# Patient Record
Sex: Female | Born: 1955 | Race: White | Hispanic: No | Marital: Married | Smoking: Former smoker
Health system: Southern US, Community
[De-identification: ages and names within clinical notes are randomized; demographics above are authoritative.]

## PROBLEM LIST (undated history)

## (undated) DIAGNOSIS — C3490 Malignant neoplasm of unspecified part of unspecified bronchus or lung: Secondary | ICD-10-CM

## (undated) DIAGNOSIS — Z923 Personal history of irradiation: Secondary | ICD-10-CM

## (undated) DIAGNOSIS — D649 Anemia, unspecified: Secondary | ICD-10-CM

## (undated) DIAGNOSIS — J189 Pneumonia, unspecified organism: Secondary | ICD-10-CM

## (undated) DIAGNOSIS — Z8719 Personal history of other diseases of the digestive system: Secondary | ICD-10-CM

## (undated) DIAGNOSIS — C801 Malignant (primary) neoplasm, unspecified: Secondary | ICD-10-CM

## (undated) DIAGNOSIS — F419 Anxiety disorder, unspecified: Secondary | ICD-10-CM

## (undated) DIAGNOSIS — R110 Nausea: Secondary | ICD-10-CM

## (undated) DIAGNOSIS — C259 Malignant neoplasm of pancreas, unspecified: Secondary | ICD-10-CM

## (undated) DIAGNOSIS — E079 Disorder of thyroid, unspecified: Secondary | ICD-10-CM

## (undated) DIAGNOSIS — J40 Bronchitis, not specified as acute or chronic: Secondary | ICD-10-CM

## (undated) DIAGNOSIS — E039 Hypothyroidism, unspecified: Secondary | ICD-10-CM

## (undated) HISTORY — DX: Malignant neoplasm of pancreas, unspecified: C25.9

## (undated) HISTORY — PX: TONSILLECTOMY: SUR1361

## (undated) HISTORY — PX: WISDOM TOOTH EXTRACTION: SHX21

## (undated) HISTORY — DX: Malignant neoplasm of unspecified part of unspecified bronchus or lung: C34.90

## (undated) HISTORY — PX: APPENDECTOMY: SHX54

## (undated) HISTORY — DX: Personal history of irradiation: Z92.3

---

## 1996-12-14 HISTORY — PX: TUBAL LIGATION: SHX77

## 1999-06-26 ENCOUNTER — Ambulatory Visit (HOSPITAL_COMMUNITY): Admission: RE | Admit: 1999-06-26 | Discharge: 1999-06-26 | Payer: Self-pay | Admitting: Obstetrics and Gynecology

## 2000-06-18 ENCOUNTER — Encounter: Admission: RE | Admit: 2000-06-18 | Discharge: 2000-06-18 | Payer: Self-pay | Admitting: Obstetrics and Gynecology

## 2000-06-18 ENCOUNTER — Encounter: Payer: Self-pay | Admitting: Obstetrics and Gynecology

## 2002-05-22 ENCOUNTER — Encounter: Payer: Self-pay | Admitting: Obstetrics and Gynecology

## 2002-05-22 ENCOUNTER — Encounter: Admission: RE | Admit: 2002-05-22 | Discharge: 2002-05-22 | Payer: Self-pay | Admitting: Obstetrics and Gynecology

## 2003-05-24 ENCOUNTER — Encounter: Admission: RE | Admit: 2003-05-24 | Discharge: 2003-05-24 | Payer: Self-pay | Admitting: Obstetrics and Gynecology

## 2003-05-24 ENCOUNTER — Encounter: Payer: Self-pay | Admitting: Obstetrics and Gynecology

## 2005-12-14 HISTORY — PX: HERNIA REPAIR: SHX51

## 2005-12-16 ENCOUNTER — Ambulatory Visit (HOSPITAL_BASED_OUTPATIENT_CLINIC_OR_DEPARTMENT_OTHER): Admission: RE | Admit: 2005-12-16 | Discharge: 2005-12-16 | Payer: Self-pay | Admitting: *Deleted

## 2006-03-24 ENCOUNTER — Encounter: Admission: RE | Admit: 2006-03-24 | Discharge: 2006-03-24 | Payer: Self-pay | Admitting: Internal Medicine

## 2006-03-30 ENCOUNTER — Encounter: Admission: RE | Admit: 2006-03-30 | Discharge: 2006-03-30 | Payer: Self-pay | Admitting: Internal Medicine

## 2006-12-14 HISTORY — PX: ABDOMINAL HYSTERECTOMY: SHX81

## 2007-05-03 ENCOUNTER — Inpatient Hospital Stay (HOSPITAL_COMMUNITY): Admission: RE | Admit: 2007-05-03 | Discharge: 2007-05-05 | Payer: Self-pay | Admitting: Obstetrics and Gynecology

## 2011-02-16 ENCOUNTER — Other Ambulatory Visit: Payer: Self-pay | Admitting: Gastroenterology

## 2012-01-21 ENCOUNTER — Other Ambulatory Visit: Payer: Self-pay | Admitting: Internal Medicine

## 2012-01-26 ENCOUNTER — Ambulatory Visit
Admission: RE | Admit: 2012-01-26 | Discharge: 2012-01-26 | Disposition: A | Discharge status: Home / Self Care | Payer: BC Managed Care – PPO | Source: Ambulatory Visit | Attending: Internal Medicine | Admitting: Internal Medicine

## 2012-01-26 ENCOUNTER — Telehealth: Payer: Self-pay | Admitting: *Deleted

## 2012-01-26 ENCOUNTER — Telehealth: Payer: Self-pay | Admitting: Gastroenterology

## 2012-01-26 ENCOUNTER — Other Ambulatory Visit: Payer: Self-pay

## 2012-01-26 DIAGNOSIS — C259 Malignant neoplasm of pancreas, unspecified: Secondary | ICD-10-CM

## 2012-01-26 MED ORDER — IOHEXOL 300 MG/ML  SOLN
100.0000 mL | Freq: Once | INTRAMUSCULAR | Status: AC | PRN
  Administered 2012-01-26: 100 mL via INTRAVENOUS

## 2012-01-26 NOTE — Telephone Encounter (Signed)
Received referral from [facility] at Dr. Lynne Logan office requesting stat oncology referral.  Dr. Truett Perna reviewed patient information and discussed the case with Dr. Christella Hartigan over the phone.  Per Dr. Truett Perna, Dr. Larae Grooms office will call patient tomorrow with appointment for EUS this week.  Per request of Dr. Lynne Logan office, the patient is not to be called today.  Patient will receive oncology appointment for next week , post visit with Christella Hartigan, later this week..  This information was also discussed with Patty,  at [facility] office, reiterating not to call patient today.  Also, Dr. Lynne Logan office is to fax over the information to Dr. Gerilyn Pilgrim 's office.

## 2012-01-26 NOTE — Telephone Encounter (Signed)
Case is scheduled for 01/29/12 1215 pm [facility] endo, pt will be called tomorrow.

## 2012-01-26 NOTE — Telephone Encounter (Signed)
I spoke with Dr. Mancel Bale. Dr. Lynne Logan office contacted him about this patient.  Patty, Can you contact Dr. Lynne Logan office to get demographics, recent labs, H and P on this patient sent here. She has newly diagnosed pancreatic mass and needs to be scheduled for upper EUS, linear only, [facility], propofol not needed, this Friday 12:15. Please block out my 1:30 L[facility] spot in case the EUS goes long.  Let's book the case today, but wait to contact her until tomorrow morning (Dr. Ricki Miller is discussing the situation with her this afternoon).  Thank you.

## 2012-01-27 NOTE — Telephone Encounter (Signed)
Left message on machine to call back on home phone  Pt has been instructed and meds have been reviewed.  She was given our phone number to call with any questions or concerns

## 2012-01-28 ENCOUNTER — Telehealth: Payer: Self-pay | Admitting: Internal Medicine

## 2012-01-28 ENCOUNTER — Telehealth: Payer: Self-pay | Admitting: *Deleted

## 2012-01-28 NOTE — Telephone Encounter (Signed)
Courtesy phone call to pt to inform her that we are aware of her upcoming appointments and are available to assist her.  I gave patient my phone number and encouraged her to call with questions.  Informed patient I will see her at Oncology MD visit.  She will be receiving a phone call soon from the [facility] scheduler.  She was without questions and appreciated the call.

## 2012-01-28 NOTE — Telephone Encounter (Signed)
Left message on machine to call back  

## 2012-01-29 ENCOUNTER — Encounter (HOSPITAL_COMMUNITY)
Admission: RE | Disposition: A | Discharge status: Home / Self Care | Payer: Self-pay | Source: Ambulatory Visit | Attending: Gastroenterology

## 2012-01-29 ENCOUNTER — Other Ambulatory Visit: Payer: Self-pay | Admitting: Gastroenterology

## 2012-01-29 ENCOUNTER — Ambulatory Visit (HOSPITAL_COMMUNITY)
Admission: RE | Admit: 2012-01-29 | Discharge: 2012-01-29 | Disposition: A | Discharge status: Home / Self Care | Payer: BC Managed Care – PPO | Source: Ambulatory Visit | Attending: Gastroenterology | Admitting: Gastroenterology

## 2012-01-29 ENCOUNTER — Telehealth: Payer: Self-pay | Admitting: Oncology

## 2012-01-29 ENCOUNTER — Encounter (HOSPITAL_COMMUNITY): Payer: Self-pay

## 2012-01-29 DIAGNOSIS — R933 Abnormal findings on diagnostic imaging of other parts of digestive tract: Secondary | ICD-10-CM

## 2012-01-29 DIAGNOSIS — C251 Malignant neoplasm of body of pancreas: Secondary | ICD-10-CM | POA: Insufficient documentation

## 2012-01-29 DIAGNOSIS — C259 Malignant neoplasm of pancreas, unspecified: Secondary | ICD-10-CM

## 2012-01-29 HISTORY — DX: Malignant neoplasm of pancreas, unspecified: C25.9

## 2012-01-29 HISTORY — PX: EUS: SHX5427

## 2012-01-29 SURGERY — UPPER ENDOSCOPIC ULTRASOUND (EUS) LINEAR
Anesthesia: Moderate Sedation

## 2012-01-29 MED ORDER — FENTANYL CITRATE 0.05 MG/ML IJ SOLN
INTRAMUSCULAR | Status: AC
  Filled 2012-01-29: qty 2

## 2012-01-29 MED ORDER — MIDAZOLAM HCL 10 MG/2ML IJ SOLN
INTRAMUSCULAR | Status: DC | PRN
  Administered 2012-01-29 (×4): 2.5 mg via INTRAVENOUS

## 2012-01-29 MED ORDER — DIPHENHYDRAMINE HCL 50 MG/ML IJ SOLN
INTRAMUSCULAR | Status: AC
  Filled 2012-01-29: qty 1

## 2012-01-29 MED ORDER — FENTANYL CITRATE 0.05 MG/ML IJ SOLN
INTRAMUSCULAR | Status: DC | PRN
  Administered 2012-01-29 (×4): 25 ug via INTRAVENOUS

## 2012-01-29 MED ORDER — MIDAZOLAM HCL 10 MG/2ML IJ SOLN
INTRAMUSCULAR | Status: AC
  Filled 2012-01-29: qty 2

## 2012-01-29 MED ORDER — DIPHENHYDRAMINE HCL 50 MG/ML IJ SOLN
INTRAMUSCULAR | Status: DC | PRN
  Administered 2012-01-29: 25 mg via INTRAVENOUS

## 2012-01-29 MED ORDER — OXYCODONE HCL 10 MG PO TB12: 10.0000 mg | ORAL_TABLET | Freq: Two times a day (BID) | ORAL | Status: DC

## 2012-01-29 MED ORDER — SODIUM CHLORIDE 0.9 % IV SOLN
INTRAVENOUS | Status: DC
  Administered 2012-01-29: 500 mL via INTRAVENOUS

## 2012-01-29 MED ORDER — BUTAMBEN-TETRACAINE-BENZOCAINE 2-2-14 % EX AERO
INHALATION_SPRAY | CUTANEOUS | Status: DC | PRN
  Administered 2012-01-29: 2 via TOPICAL

## 2012-01-29 NOTE — Op Note (Signed)
[facility] [address] [place], [place]  [address]  ENDOSCOPIC ULTRASOUND PROCEDURE REPORT  PATIENT:  Victoria Price, Victoria Price  MR#:  [id number] BIRTHDATE:  [birth year]  GENDER:  female ENDOSCOPIST:  Rachael Fee, MD REFERRED BY:  Lavada Mesi. Truett Perna, M.D. Juline Patch, M.D. 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 The patient was then placed in the left, lateral, decubitus postion and IV sedation was administered. Throughout the procedure, the patient's blood pressure, pulse and oxygen saturations were monitored continuously.  Under direct visualization, the  endoscope was introduced through the mouth and advanced to the bulb of duodenum.  Water was used as necessary to provide an acoustic interface.  Upon completion of the imaging, water was removed and the patient was sent to the recovery room in satisfactory condition. <<PROCEDUREIMAGES>>  Endoscopic findings (limited exam with linear echoendoscope): 1. Normal esophagus and stomach  EUS findings: 1. Large, heterogeneous, hypoechoic mass in body of pancreas with indistinct outer margins. The mass is mainly solid but there are some small cystic regions as well. This measured at least 4cm across on this exam.  There was clear soft tissue mass within a large vessel (likely tumor within vessel portal vein based on CT scan). The mass is causing proximal pancreatic duct obstruction (dilated to 6mm in tail). The mass was sampled with 2 passes of a 19 gauge BS EUS FNA needle. 2. Limited views of liver, spleen  were normal.  Impression: Large mass in body of pancreas that appeared not to be resectable on recent CT scan based on tumor invasion into major vessels (portal vein and probably SMV).  FNA performed, preliminary cytology review was positive for malignancy (carcinoma). Await final report. She is seeing Dr. Truett Perna week.  Will prescribe twice daily pain meds.  ______________________________ Rachael Fee, MD  n. eSIGNED:   Rachael Fee at 01/29/2012 01:39 PM  Kennyth Arnold, [phone]

## 2012-01-29 NOTE — Telephone Encounter (Signed)
called pt home and scheduled new pt appt for 02/21 @ 10:30am. faxed letter to Dr. Ricki Miller with appt d/t

## 2012-01-29 NOTE — H&P (Signed)
  HPI: This is a very pleasant woman with abd pain, weight loss. Found on recent ct to have large pancreatic body mass, unresectable by CT    History reviewed. No pertinent past medical history.  Past Surgical History  Procedure Date  . Abdominal hysterectomy   . Hernia repair   . Tubal ligation   . Tonsillectomy   . Cesarean section   . Appendectomy     No current facility-administered medications for this encounter.    Allergies as of 01/26/2012  . (Not on File)    History reviewed. No pertinent family history.  History   Social History  . Marital Status: Married    Spouse Name: N/A    Number of Children: N/A  . Years of Education: N/A   Occupational History  . Not on file.   Social History Main Topics  . Smoking status: Former Smoker    Quit date: 01/29/2012  . Smokeless tobacco: Not on file  . Alcohol Use: 0.5 oz/week    1 drink(s) per week  . Drug Use: No  . Sexually Active: Yes   Other Topics Concern  . Not on file   Social History Narrative  . No narrative on file      Physical Exam: BP 154/77  Pulse 52  Temp(Src) 97.9 F (36.6 C) (Oral)  Resp 9  Ht 5\' 1"  (1.549 m)  Wt 186 lb (84.369 kg)  BMI 35.14 kg/m2  SpO2 96% Constitutional: generally well-appearing Psychiatric: alert and oriented x3 Abdomen: soft, nontender, nondistended, no obvious ascites, no peritoneal signs, normal bowel sounds     Assessment and plan: 56 y.o. female with pancreatic mass  For EUS, FNA today

## 2012-01-29 NOTE — Telephone Encounter (Signed)
Pt was seen today for a procedure questions answered by Dr Christella Hartigan

## 2012-02-01 ENCOUNTER — Telehealth: Payer: Self-pay | Admitting: Oncology

## 2012-02-01 NOTE — Telephone Encounter (Signed)
Referred by Dr. Ricki Miller Dx- Pancreatic

## 2012-02-04 ENCOUNTER — Ambulatory Visit: Payer: BC Managed Care – PPO | Admitting: Lab

## 2012-02-04 ENCOUNTER — Other Ambulatory Visit: Payer: BC Managed Care – PPO | Admitting: Lab

## 2012-02-04 ENCOUNTER — Ambulatory Visit: Payer: BC Managed Care – PPO

## 2012-02-04 ENCOUNTER — Telehealth: Payer: Self-pay | Admitting: Oncology

## 2012-02-04 ENCOUNTER — Ambulatory Visit (HOSPITAL_BASED_OUTPATIENT_CLINIC_OR_DEPARTMENT_OTHER): Payer: BC Managed Care – PPO | Admitting: Oncology

## 2012-02-04 DIAGNOSIS — C259 Malignant neoplasm of pancreas, unspecified: Secondary | ICD-10-CM

## 2012-02-04 DIAGNOSIS — G893 Neoplasm related pain (acute) (chronic): Secondary | ICD-10-CM

## 2012-02-04 DIAGNOSIS — K5900 Constipation, unspecified: Secondary | ICD-10-CM

## 2012-02-04 LAB — CBC WITH DIFFERENTIAL/PLATELET
BASO%: 0.4 % (ref 0.0–2.0)
Eosinophils Absolute: 0.1 10*3/uL (ref 0.0–0.5)
LYMPH%: 27.2 % (ref 14.0–49.7)
MCHC: 33.3 g/dL (ref 31.5–36.0)
MCV: 91.2 fL (ref 79.5–101.0)
MONO#: 0.5 10*3/uL (ref 0.1–0.9)
MONO%: 8.8 % (ref 0.0–14.0)
NEUT#: 3.5 10*3/uL (ref 1.5–6.5)
Platelets: 216 10*3/uL (ref 145–400)
RBC: 4.28 10*6/uL (ref 3.70–5.45)
RDW: 13.9 % (ref 11.2–14.5)
WBC: 5.7 10*3/uL (ref 3.9–10.3)

## 2012-02-04 LAB — COMPREHENSIVE METABOLIC PANEL
ALT: 44 U/L — ABNORMAL HIGH (ref 0–35)
Albumin: 4.1 g/dL (ref 3.5–5.2)
Alkaline Phosphatase: 129 U/L — ABNORMAL HIGH (ref 39–117)
Potassium: 4.4 mEq/L (ref 3.5–5.3)
Sodium: 139 mEq/L (ref 135–145)
Total Bilirubin: 0.5 mg/dL (ref 0.3–1.2)
Total Protein: 7.3 g/dL (ref 6.0–8.3)

## 2012-02-04 NOTE — Progress Notes (Signed)
Referring MD: Barbee Shropshire 56 y.o.  [birth year]    Reason for Referral: New diagnosis of pancreas cancer     HPI: She noted the onset of constipation and left-sided abdominal pain in December of 2012. She saw Dr.Pang when the pain persisted and she was referred for a CT of the abdomen on 01/26/2012. No pulmonary nodules or pleural effusion it were seen. The liver appeared unremarkable. The adrenal glands and kidneys appeared unremarkable. A 5 cm pancreatic body mass was noted with marked dilatation of the pancreatic duct in the pancreatic 10. The splenic vein was occluded. Tumor or tumor thrombus was noted in the portal vein and the superior mesenteric vein was involved with tumor near the confluence. No obvious to rectum although the superior mesenteric artery or celiac axis. Tumor was noted to abut the posterior wall of the stomach. Borderline enlarged celiac nodes were seen.  She was referred to Dr. Brooke Pace underwent an endoscopic ultrasound procedure on 01/29/2012. A large mass was noted in the body of the pancreas. The mass was solid with small cystic regions. The mass measured at least 4 cm across. A soft tissue mass was noted within a large vessel felt to likely represent tumor within the portal vein. Views of the liver and spleen were normal. The mass was biopsied and the cytology ([id number]) confirmed malignant cells consistent with adenosquamous carcinoma.  She continues to have left-sided abdominal pain. Constipation has been relieved with MiraLAX. She takes hydrocodone twice daily for pain. The pain is partially relieved with this regimen. She was prescribed OxyContin by Dr. Christella Hartigan but she has not taken this yet  Past medical history: 1. Seasonal and food allergies 2. Low libido treated with "testosterone shots " 3. G1 P1 4. Hypothyroidism  Past Surgical History  Procedure Date  . Abdominal hysterectomy  2008   . Hernia repair  2007  . Tubal ligation  1998   .  Tonsillectomy   . Cesarean section  1981   . Appendectomy    . Hernia repair as a "baby "  Family history: She had 3 brothers and one sister. Her sister died with lung cancer and was a smoker. A maternal uncle had "cancer ". A maternal aunt died of lung cancer.   Current outpatient prescriptions:estradiol (ESTRING) 2 MG vaginal ring, Place 0.05 mg vaginally every 3 (three) months. follow package directions, Disp: , Rfl: ;  fish oil-omega-3 fatty acids 1000 MG capsule, Take 2 g by mouth daily., Disp: , Rfl: ;  fluticasone (FLONASE) 50 MCG/ACT nasal spray, Place 2 sprays into the nose daily., Disp: , Rfl:  HYDROcodone-acetaminophen (VICODIN) 5-500 MG per tablet, Take 1 tablet by mouth 2 (two) times daily as needed., Disp: , Rfl: ;  levothyroxine (SYNTHROID, LEVOTHROID) 100 MCG tablet, Take 44 mcg by mouth daily., Disp: , Rfl: ;  Polyethyl Glycol-Propyl Glycol (SYSTANE ULTRA OP), Apply 2 drops to eye 2 (two) times daily., Disp: , Rfl: ;  polyethylene glycol (MIRALAX / GLYCOLAX) packet, Take 17 g by mouth daily., Disp: , Rfl:  testosterone cypionate (DEPOTESTOTERONE CYPIONATE) 200 MG/ML injection, Inject into the muscle., Disp: , Rfl: ;  oxyCODONE (OXYCONTIN) 10 MG 12 hr tablet, Take 1 tablet (10 mg total) by mouth every 12 (twelve) hours., Disp: 75 tablet, Rfl: 0  Allergies:  Allergies  Allergen Reactions  . Dairy Digestive Swelling  . Peanut-Containing Drug Products Itching  . Corn-Containing Products Itching  . Wheat Itching  . Chicken Allergy Rash  .  Cinnamon Itching  . Citrus Rash  . Eggs Or Egg-Derived Products Rash  . Strawberry Rash    Social History: She lives in [place]. 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 She quit smoking cigarettes in 2008. She drinks alcohol on social occasions. She denies risk factors for HIV and hepatitis.    History  Alcohol Use  . 0.5 oz/week  . 1 drink(s) per week    History  Smoking status  . Former Smoker  . Quit  date:   Smokeless tobacco  . Not on file     ROS:   Positives include: Anorexia and a 19 pound weight loss, nausea-no emesis, sinus headaches, dry skin, left abdominal pain  A complete ROS was otherwise negative.  Physical Exam: Weight 181.5 pounds, temperature 97.4, blood pressure 120/79, pulse 69, respirations 16 HEENT: Oropharynx without visible mass, neck without mass Lungs: Clear bilaterally Cardiac: Regular rate and rhythm Abdomen: Nontender, no mass, no hepatosplenic the, no apparent ascites  Vascular: No leg edema Lymph nodes: No cervical, supraclavicular, axillary, or inguinal nodes Neurologic: Alert and oriented, the motor examination is intact in the upper and lower extremities Skin: No rash, multiple benign appearing moles over the trunk   LAB:  CBC  Lab Results  Component Value Date   WBC 5.7 02/04/2012   HGB 13.0 02/04/2012   HCT 39.0 02/04/2012   MCV 91.2 02/04/2012   PLT 216 02/04/2012     Radiology: I reviewed the abdominal CT from 01/26/2012 with the patient and her husband. There is a pancreas body mass with tumor or thrombus in the portal vein.    Assessment/Plan:   1. Locally advanced pancreas cancer-adenosquamous carcinoma, presenting with a pancreas body mass and CT/ultrasound evidence of portal vein/SMV invasion.  2. Pain secondary to the locally Vance pancreas mass  3. Nausea-? Related to tumor abutting the stomach  4. Constipation   Disposition:   She has been diagnosed with a locally advanced pancreas tumor. The tumor appears unresectable based on the CT imaging and endoscopic ultrasound evaluation. We discussed her case at the GI tumor conference yesterday. The tumor was felt to be unresectable.  She is symptomatic with pain, though this is not severe. I encouraged her to use the OxyContin for background pain relief and continue hydrocodone as needed for breakthrough pain.  We obtained a chemistry panel and CA 19-9 9 today. She will be  scheduled for a staging PET scan within the next week. We will consider treatment options when she returns for an office visit on 02/18/2012. I will recommend systemic chemotherapy if she has distant metastatic disease. We will consider chemotherapy and radiation if there is no evidence of distant disease.  I recommended she not drive/work while taking narcotics. She plans to take a leave of absence from work.  Rosenda Geffrard BRADLEY 02/04/2012, 1:56 PM

## 2012-02-04 NOTE — Progress Notes (Signed)
Addended by: Vicie Mutters B on: 02/04/2012 02:23 PM   Modules accepted: Orders

## 2012-02-04 NOTE — Telephone Encounter (Signed)
Gv pt appt for march2013.   scheduled pt for pet scan on 03/01 @ [facility]

## 2012-02-05 ENCOUNTER — Ambulatory Visit: Payer: BC Managed Care – PPO | Admitting: Nutrition

## 2012-02-05 NOTE — Assessment & Plan Note (Signed)
The patient is a 56 year old female patient of Dr. Truett Perna diagnosed with pancreatic cancer.   HISTORY:  Includes seasonal and food allergies, hypothyroidism.   MEDICATIONS:  Include omega-3 fatty acids, Synthroid, and MiraLAX.  LABS:  Were reviewed.  VITAL SIGNS:  Height is 61 inches, weight 181 pounds, usual body weight 200 pounds, and BMI of 35.6.  The patient was requesting assistance with nutrition supplements.  She reports a multitude of allergies to various foods to include peanuts, wheat, chicken, cinnamon, citrus, eggs, strawberry, milk, and corn. Upon talking to the patient, she does not appear to have an extensive allergy of all the foods if she is careful with how much of some of these foods she consume and does not consume too much at a time or in conjunction with other potential allergens. It was difficult to determine the level of her reaction to foods, that she sometimes describes no problems when she eats some of these foods, to the point of itching when she eats some of these foods. So it appears that she does consume some of these foods at certain times, but it was difficult to understand the extent of her allergic reaction.  She does also complain of anorexia and constipation and abdominal bloating.  She has had severe weight loss.   NUTRITION DIAGNOSIS:  Unintended weight loss related to diagnosis of pancreatic cancer and associated treatments as evidenced by a 9% weight loss from her usual body weight.  INTERVENTION:  I have spoken with Ms. Cavell on the telephone today trying to get more information about her food allergies. I did some brief education with her on strategies for increasing frequency of meals and to consume small amounts of foods well-tolerated, often throughout the day.  I have encouraged her to consume a protein that she tolerates every time she does eat. She would like to try some nutritional supplements such as Ensure or Boost. Even though they do contain a  corn maltodextrin, she is willing to experiment with these and see if there are any that she could tolerate. Most of these products are lactose-free and gluten free, but they are not dairy-free or corn-free. However, the patient feels that she would like to try these. So I have stressed the importance of only consuming a small amount of these products at a time and waiting 2 to 3 days after consumption to ensure tolerance.  I provided samples for her as well as fact sheets that her husband is going to pick up for her.  I have encouraged her to call me when she has determined tolerance and we can set up a followup appointment as needed and desired.  MONITORING/EVALUATION (GOALS):  The patient will be able to increase her oral intake of foods that she tolerates to minimize further weight loss.   NEXT VISIT:  The patient will call me for followup.   ______________________________ Zenovia Jarred, RD, LDN Clinical Nutrition Specialist BN/MEDQ  D:  02/05/2012  T:  02/05/2012  Job:  [id number]

## 2012-02-08 ENCOUNTER — Telehealth: Payer: Self-pay | Admitting: *Deleted

## 2012-02-08 NOTE — Telephone Encounter (Signed)
Phone call from pt requesting information on how to apply for disability and which forms she she ask her employer for.  I suggested she speak with the SW who is the expert on assisting pts with this.  Pt denied further questions and is anxious to get PET scan and to begin treatment.  Encouraged pt to call for questions or assistance.  SW referral was made to [facility].  She will contact the patient tomorrow.

## 2012-02-09 ENCOUNTER — Encounter: Payer: Self-pay | Admitting: *Deleted

## 2012-02-09 NOTE — Progress Notes (Signed)
[facility] Brief Psychosocial Assessment Clinical Social Work  Clinical Social Work was referred by patient navigator for assessment of psychosocial needs.  Clinical Social Worker contacted patient by phone to offer support and assess for needs.  Pt recently received a new diagnosis of pancreatic cancer, and has multiple questions regarding disability.  Pt expressed concern for disability and how to proceed.  CSW validated the pt's concerns and explored pt's employee benefits.  Pt was unable to recall if she had any short term or long term benefits through her employer.  CSW encouraged the pt to contact her Human Resources department for information on her benefits and eligibility.  Pt has questions that could not be answered at this time due to the lack of information provided.  Once the pt contacts her HR department regarding her employee benefits CSW can provide further support.  CSW informed pt of the support available at [facility] including; disability, FLMA, financial counselors, and [facility] support team.  CSW offered to consult with financial counseling and have them make contact with pt, but pt stated she would prefer to make contact.  CSW encouraged the pt to call if she has any additional questions or concerns.  CSW will follow up with pt at her next appointment.         Tamala Julian, MSW, LCSW Clinical Social Worker [facility] [phone]

## 2012-02-11 ENCOUNTER — Encounter (HOSPITAL_COMMUNITY): Payer: Self-pay | Admitting: Gastroenterology

## 2012-02-12 ENCOUNTER — Encounter (HOSPITAL_COMMUNITY)
Admission: RE | Admit: 2012-02-12 | Discharge: 2012-02-12 | Disposition: A | Discharge status: Home / Self Care | Payer: BC Managed Care – PPO | Source: Ambulatory Visit | Attending: Oncology | Admitting: Oncology

## 2012-02-12 DIAGNOSIS — C259 Malignant neoplasm of pancreas, unspecified: Secondary | ICD-10-CM | POA: Insufficient documentation

## 2012-02-12 DIAGNOSIS — K7689 Other specified diseases of liver: Secondary | ICD-10-CM | POA: Insufficient documentation

## 2012-02-12 DIAGNOSIS — R222 Localized swelling, mass and lump, trunk: Secondary | ICD-10-CM | POA: Insufficient documentation

## 2012-02-12 MED ORDER — FLUDEOXYGLUCOSE F - 18 (FDG) INJECTION
18.2000 | Freq: Once | INTRAVENOUS | Status: AC | PRN
  Administered 2012-02-12: 18.2 via INTRAVENOUS

## 2012-02-15 ENCOUNTER — Telehealth: Payer: Self-pay | Admitting: Nutrition

## 2012-02-15 NOTE — Telephone Encounter (Signed)
Patient called and wondered where she could purchase Ensure Enlive, a nutritional supplement I had provided a sample of for her to try.  She reports good tolerance for this product.  I encouraged her to call the [facility] to order product or directed her to online sources where she could order the product herself.  Patient appreciative of information.

## 2012-02-18 ENCOUNTER — Telehealth: Payer: Self-pay | Admitting: *Deleted

## 2012-02-18 ENCOUNTER — Ambulatory Visit (HOSPITAL_BASED_OUTPATIENT_CLINIC_OR_DEPARTMENT_OTHER): Payer: BC Managed Care – PPO | Admitting: Oncology

## 2012-02-18 ENCOUNTER — Encounter: Payer: Self-pay | Admitting: *Deleted

## 2012-02-18 VITALS — BP 140/93 | HR 71 | Temp 97.5°F | Ht 61.0 in | Wt 176.5 lb

## 2012-02-18 DIAGNOSIS — G893 Neoplasm related pain (acute) (chronic): Secondary | ICD-10-CM

## 2012-02-18 DIAGNOSIS — K5900 Constipation, unspecified: Secondary | ICD-10-CM

## 2012-02-18 DIAGNOSIS — R222 Localized swelling, mass and lump, trunk: Secondary | ICD-10-CM

## 2012-02-18 DIAGNOSIS — C259 Malignant neoplasm of pancreas, unspecified: Secondary | ICD-10-CM

## 2012-02-18 DIAGNOSIS — C3490 Malignant neoplasm of unspecified part of unspecified bronchus or lung: Secondary | ICD-10-CM

## 2012-02-18 MED ORDER — HYDROMORPHONE HCL 4 MG PO TABS: ORAL_TABLET | ORAL | Status: DC

## 2012-02-18 MED ORDER — OXYCODONE HCL 20 MG PO TB12: 20.0000 mg | ORAL_TABLET | Freq: Two times a day (BID) | ORAL | Status: DC

## 2012-02-18 NOTE — Progress Notes (Signed)
Pt reports she had itchy mouth and throat after having PET scan. She relates this to what she drank for PET. Will follow up with nuclear med to update allergy/ intolerance list. She reports similar reaction an hour post CT scan.   Pt reports a maternal aunt had small cell lung CA. 2 uncles with prostate cancer and one uncle kidney cancer. Wanted to update history. (all maternal) Paternal uncle with melanoma.  Father had a squamos cell keratoncanthoma.

## 2012-02-18 NOTE — Telephone Encounter (Signed)
Left message on voicemail for pt with appt for Dr. Cyril Mourning. Friday 02/19/12 at 1545 for 1600 appt. Requested pt return call to confirm.

## 2012-02-18 NOTE — Progress Notes (Signed)
OFFICE PROGRESS NOTE   INTERVAL HISTORY:   She returns as scheduled. The abdominal pain persists. OxyContin and hydrocodone have not relieved the pain. The pain is worse after eating. The pain is localized to the left upper abdomen and low lateral abdominal/chest wall. Constipation was relieved with MiraLAx,, but she has noted increased stool frequency while taking daily MiraLAX.  Objective:  Vital signs in last 24 hours:  Blood pressure 140/93, pulse 71, temperature 97.5 F (36.4 C), temperature source Oral, height 5\' 1"  (1.549 m), weight 176 lb 8 oz (80.06 kg).   Resp: Lungs clear bilaterally Cardio: Regular rate and rhythm GI: Nontender, no mass, no hepatomegaly Vascular: No leg edema   Musculoskeletal: No spine tenderness  Lab Results:  Lab Results  Component Value Date   WBC 5.7 02/04/2012   HGB 13.0 02/04/2012   HCT 39.0 02/04/2012   MCV 91.2 02/04/2012   PLT 216 02/04/2012   X-rays: Staging PET scan on 02/12/2012 confirms a hypermetabolic mass involving the pancreas, small hypermetabolic focus along the capsular margin of the right hepatic lobe, no other hypermetabolic activity in the abdomen or pelvis. No abnormal metabolic activity within the liver. Large hypermetabolic left pulmonary mass . No other hypermetabolic pulmonary findings.   Medications: I have reviewed the patient's current medications.  Assessment/Plan: 1. Locally advanced pancreas cancer-adenosquamous carcinoma, presenting with a pancreas body mass and CT/ultrasound evidence of portal vein/SMV invasion.  2. Pain secondary to the locally advanced pancreas mass  3. Nausea-? Related to tumor abutting the stomach  4. Constipation 5. Staging PET scan 02/12/2012 confirming a hypermetabolic left lung mass, hypermetabolic pancreas mass, hypermetabolic peritoneal implant.   Disposition:  The staging PET scan confirmed a large hypermetabolic left lung mass. The biopsy of the pancreas lesion revealed an  adenosquamous histology. She may have synchronous primary tumors or metastatic lung cancer.  I reviewed the PET scan with the patient and her family. We will arrange for a pulmonary evaluation to consider a bronchoscopy/biopsy of the left lung mass.  We will choose a different systemic chemotherapy regimen if she is confirmed to have metastatic non-small cell lung cancer.  The OxyContin was increased to 20 mg every 12 hours. She will use Dilaudid as needed for breakthrough pain. She will try Senokot for the constipation.  She is scheduled to see Dr.Alva on 02/19/2012. She will return for an office visit here on 03/02/2012. She knows to contact us in the interim if the pain is not controlled with OxyContin/dilaudid.   Lucile Shutters, MD  02/18/2012  9:39 PM

## 2012-02-18 NOTE — Telephone Encounter (Signed)
NOTIFIED DR.SHERRILL OF ABOVE APPOINTMENT FOR PT. SCHEDULER, ROSE, NOTIFIED PT. OF THE APPOINTMENT WITH DR.ALVA. SHE ALSO GAVE PT. A CALENDAR WITH THE APPOINTMENT.

## 2012-02-18 NOTE — Progress Notes (Signed)
Clinical Social Worker met with pt in the exam room to follow up regarding disability concerns, and to offer additional support.  Pt's husband and mother were with pt in the exam room.  Pt informed CSW that she was able to contact her employers HR department, and was provided all necessary information.  Pt has approximately 150 days of sick leave remaining, once she has used all of her sick leave her disability benefits will begin.  CSW discussed and provided information on [facility] supportive services and support team members with pt and pt's family.  CSW encouraged pt and family members to take advantage of the emotional/spiritual support available.  CSW also encourged pt and/or family to call with any questions or concerns.  CSW will continue to support pt and family as needed.    Tamala Julian, MSW, LCSW Clinical Social Worker [facility] [phone]

## 2012-02-19 ENCOUNTER — Encounter: Payer: Self-pay | Admitting: Pulmonary Disease

## 2012-02-19 ENCOUNTER — Ambulatory Visit (INDEPENDENT_AMBULATORY_CARE_PROVIDER_SITE_OTHER): Payer: BC Managed Care – PPO | Admitting: Pulmonary Disease

## 2012-02-19 ENCOUNTER — Telehealth: Payer: Self-pay | Admitting: *Deleted

## 2012-02-19 VITALS — BP 110/68 | HR 68 | Temp 98.1°F | Ht 61.0 in | Wt 178.4 lb

## 2012-02-19 DIAGNOSIS — C259 Malignant neoplasm of pancreas, unspecified: Secondary | ICD-10-CM | POA: Insufficient documentation

## 2012-02-19 DIAGNOSIS — R222 Localized swelling, mass and lump, trunk: Secondary | ICD-10-CM

## 2012-02-19 DIAGNOSIS — R918 Other nonspecific abnormal finding of lung field: Secondary | ICD-10-CM | POA: Insufficient documentation

## 2012-02-19 MED ORDER — PROCHLORPERAZINE MALEATE 10 MG PO TABS: 10.0000 mg | ORAL_TABLET | Freq: Four times a day (QID) | ORAL | Status: DC | PRN

## 2012-02-19 NOTE — Progress Notes (Signed)
Subjective:    Patient ID: Victoria Price, female    DOB: 11/05/1956, 55 y.o.   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 She presented with constipation & meal related pains since jan' 13, underwent Ct abdomen 01/26/12 which showed a 5 cm mass in the pancreatic body . 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 The tumor abutted the posterior wall of the stomach . EUS (Jacobs) biopsy confirmed adenosquamous carcinoma.  Staging PET scan 02/12/2012 showed a hypermetabolic left lung mass -This extended inferiorly from the left hilum and is primarily within the superior segment of the lower lobe, although the lesion appears to cross the fissure centrally. This mass measures 5.1 x 5.2 cm transverse on image 80 and has an SUV max of 16.8. There was some adjacent postobstructive pneumonitis in the superior segment of the left lower lobe.The pancreatic mass & a peritoneal implant were noted to be  Hypermetabolic. Her main complaints now are abdominal pain - she has been started on dilaudid & oxycontin. She has lost about 20 lbs int he last 3 months. She denies cough, hemoptysis or wheezing She repors asthma since childhood which seems to be mild intermitent , has not required MDIs for at least the last 4 years  Past Medical History  Diagnosis Date  . Asthma   . Allergic rhinitis    Past Surgical History  Procedure Date  . Abdominal hysterectomy   . Hernia repair   . Tubal ligation   . Tonsillectomy   . Cesarean section   . Appendectomy   . Eus 01/29/2012    Procedure: UPPER ENDOSCOPIC ULTRASOUND (EUS) LINEAR;  Surgeon: Daniel Jacobs, MD;  Location: [facility];  Service: Endoscopy;  Laterality: N/A;  Pt. doesn't know she has CA.... Do not call today (2/12) - office is calling tomorrow.   Allergies  Allergen Reactions  . Dairy Digestive Swelling  .  Peanut-Containing Drug Products Itching  . Corn-Containing Products Itching  . Wheat Itching  . Chicken Allergy Rash  . Cinnamon Itching  . Citrus Rash  . Eggs Or Egg-Derived Products Rash  . Strawberry Rash      Review of Systems  Constitutional: Positive for unexpected weight change. Negative for fever.  HENT: Negative for ear pain, nosebleeds, congestion, sore throat, rhinorrhea, sneezing, trouble swallowing, dental problem, postnasal drip and sinus pressure.   Eyes: Negative for redness and itching.  Respiratory: Negative for cough, chest tightness, shortness of breath and wheezing.   Cardiovascular: Negative for palpitations and leg swelling.  Gastrointestinal: Positive for abdominal pain. Negative for nausea and vomiting.       Indigestion  Genitourinary: Negative for dysuria.  Musculoskeletal: Negative for joint swelling.  Skin: Negative for rash.  Neurological: Negative for headaches.  Hematological: Does not bruise/bleed easily.  Psychiatric/Behavioral: Negative for dysphoric mood. The patient is not nervous/anxious.        Objective:   Physical Exam  Gen. Pleasant, well-nourished, in no distress, normal affect ENT - no lesions, no post nasal drip Neck: No JVD, no thyromegaly, no carotid bruits Lungs: no use of accessory muscles, no dullness to percussion, clear without rales or rhonchi  Cardiovascular: Rhythm regular, heart sounds  normal, no murmurs or gallops, no peripheral edema Abdomen: soft and non-tender, no hepatosplenomegaly, BS normal. Musculoskeletal: No deformities, no cyanosis or clubbing Neuro:  alert, non focal         Assessment & Plan:   

## 2012-02-19 NOTE — Patient Instructions (Signed)
We will proceed with video bronchoscopy with ultrasound guidance biopsy in the OR Risks & benefits discussed

## 2012-02-19 NOTE — Assessment & Plan Note (Signed)
Unclear whether we are dealing with pancreatic or lung primary here or less likely synchronous malignancies. Her symptoms seem to be primarily abdominal but her lung mass appears bigger.We went over her pET images. I do not see an obvious endobronchial component. Best strategy would be video bronchoscopy with EBUS guidance & obtin biopsies of this hilar mass The risks of the procedure including coughing, bleeding and the small chance of lung puncture requiring chest tube were discussed in great detail. The benefits & alternatives were also discussed. She is agreeable & we will proceed as soon as we can schedule

## 2012-02-19 NOTE — Telephone Encounter (Signed)
Called pt, she is aware of pulmonology appt today. Reports nausea today. Unsure if this is related to Dilaudid. Requests rx for nausea.

## 2012-02-22 ENCOUNTER — Encounter (HOSPITAL_COMMUNITY): Payer: Self-pay | Admitting: Pharmacy Technician

## 2012-02-22 ENCOUNTER — Encounter: Payer: Self-pay | Admitting: Oncology

## 2012-02-22 NOTE — Progress Notes (Signed)
Faxed fmla papers to [employer] @ [phone].

## 2012-02-23 ENCOUNTER — Telehealth: Payer: Self-pay | Admitting: Pulmonary Disease

## 2012-02-23 ENCOUNTER — Encounter (HOSPITAL_COMMUNITY): Payer: Self-pay

## 2012-02-23 ENCOUNTER — Telehealth: Payer: Self-pay | Admitting: Nutrition

## 2012-02-23 ENCOUNTER — Other Ambulatory Visit: Payer: Self-pay | Admitting: Pulmonary Disease

## 2012-02-23 NOTE — Telephone Encounter (Signed)
k - done

## 2012-02-23 NOTE — Telephone Encounter (Signed)
Dr Vassie Loll short stay needs preop orders

## 2012-02-23 NOTE — Progress Notes (Signed)
Called Dr. Reginia Naas office, spoke with [facility] requested preop orders.

## 2012-02-23 NOTE — Telephone Encounter (Signed)
Patient called wondering how many nutrition supplements she should consume daily.  She is not eating well and states she continues to lose about 2 pounds weekly.  I advised her to consume 2-3 Juice based supplements daily as tolerated.

## 2012-02-24 ENCOUNTER — Ambulatory Visit (HOSPITAL_COMMUNITY): Payer: BC Managed Care – PPO

## 2012-02-24 ENCOUNTER — Ambulatory Visit (HOSPITAL_COMMUNITY)
Admission: RE | Admit: 2012-02-24 | Discharge: 2012-02-24 | Disposition: A | Discharge status: Home / Self Care | Payer: BC Managed Care – PPO | Source: Ambulatory Visit | Attending: Emergency Medicine | Admitting: Emergency Medicine

## 2012-02-24 ENCOUNTER — Ambulatory Visit (HOSPITAL_COMMUNITY): Payer: BC Managed Care – PPO | Admitting: Anesthesiology

## 2012-02-24 ENCOUNTER — Encounter (HOSPITAL_COMMUNITY): Payer: Self-pay | Admitting: *Deleted

## 2012-02-24 ENCOUNTER — Other Ambulatory Visit: Payer: Self-pay | Admitting: *Deleted

## 2012-02-24 ENCOUNTER — Encounter (HOSPITAL_COMMUNITY): Payer: Self-pay | Admitting: Anesthesiology

## 2012-02-24 ENCOUNTER — Encounter (HOSPITAL_COMMUNITY)
Admission: RE | Disposition: A | Discharge status: Home / Self Care | Payer: Self-pay | Source: Ambulatory Visit | Attending: Emergency Medicine

## 2012-02-24 DIAGNOSIS — J309 Allergic rhinitis, unspecified: Secondary | ICD-10-CM | POA: Insufficient documentation

## 2012-02-24 DIAGNOSIS — C3490 Malignant neoplasm of unspecified part of unspecified bronchus or lung: Secondary | ICD-10-CM

## 2012-02-24 DIAGNOSIS — R222 Localized swelling, mass and lump, trunk: Secondary | ICD-10-CM | POA: Insufficient documentation

## 2012-02-24 DIAGNOSIS — C259 Malignant neoplasm of pancreas, unspecified: Secondary | ICD-10-CM | POA: Insufficient documentation

## 2012-02-24 DIAGNOSIS — R599 Enlarged lymph nodes, unspecified: Secondary | ICD-10-CM | POA: Diagnosis not present

## 2012-02-24 DIAGNOSIS — C3430 Malignant neoplasm of lower lobe, unspecified bronchus or lung: Secondary | ICD-10-CM | POA: Diagnosis not present

## 2012-02-24 DIAGNOSIS — C771 Secondary and unspecified malignant neoplasm of intrathoracic lymph nodes: Secondary | ICD-10-CM | POA: Insufficient documentation

## 2012-02-24 DIAGNOSIS — J45909 Unspecified asthma, uncomplicated: Secondary | ICD-10-CM | POA: Insufficient documentation

## 2012-02-24 HISTORY — DX: Malignant (primary) neoplasm, unspecified: C80.1

## 2012-02-24 HISTORY — DX: Pneumonia, unspecified organism: J18.9

## 2012-02-24 HISTORY — DX: Malignant neoplasm of unspecified part of unspecified bronchus or lung: C34.90

## 2012-02-24 HISTORY — DX: Anemia, unspecified: D64.9

## 2012-02-24 HISTORY — DX: Nausea: R11.0

## 2012-02-24 HISTORY — DX: Anxiety disorder, unspecified: F41.9

## 2012-02-24 HISTORY — DX: Disorder of thyroid, unspecified: E07.9

## 2012-02-24 HISTORY — DX: Personal history of other diseases of the digestive system: Z87.19

## 2012-02-24 HISTORY — DX: Bronchitis, not specified as acute or chronic: J40

## 2012-02-24 LAB — CBC
HCT: 36.4 % (ref 36.0–46.0)
Hemoglobin: 12.2 g/dL (ref 12.0–15.0)
MCV: 86.7 fL (ref 78.0–100.0)
RBC: 4.2 MIL/uL (ref 3.87–5.11)
RDW: 13.3 % (ref 11.5–15.5)
WBC: 5.5 10*3/uL (ref 4.0–10.5)

## 2012-02-24 LAB — BASIC METABOLIC PANEL
CO2: 28 mEq/L (ref 19–32)
Chloride: 101 mEq/L (ref 96–112)
Creatinine, Ser: 0.63 mg/dL (ref 0.50–1.10)

## 2012-02-24 SURGERY — BRONCHOSCOPY, WITH EBUS
Anesthesia: General | Site: Chest | Wound class: Clean Contaminated

## 2012-02-24 SURGERY — BRONCHOSCOPY, WITH EBUS
Anesthesia: General | Laterality: Left

## 2012-02-24 MED ORDER — FENTANYL CITRATE 0.05 MG/ML IJ SOLN
INTRAMUSCULAR | Status: DC | PRN
  Administered 2012-02-24 (×3): 50 ug via INTRAVENOUS

## 2012-02-24 MED ORDER — ROCURONIUM BROMIDE 100 MG/10ML IV SOLN
INTRAVENOUS | Status: DC | PRN
  Administered 2012-02-24: 30 mg via INTRAVENOUS

## 2012-02-24 MED ORDER — ONDANSETRON HCL 4 MG/2ML IJ SOLN
INTRAMUSCULAR | Status: DC | PRN
  Administered 2012-02-24: 4 mg via INTRAVENOUS

## 2012-02-24 MED ORDER — 0.9 % SODIUM CHLORIDE (POUR BTL) OPTIME
TOPICAL | Status: DC | PRN
  Administered 2012-02-24: 1000 mL

## 2012-02-24 MED ORDER — LACTATED RINGERS IV SOLN
INTRAVENOUS | Status: DC | PRN
  Administered 2012-02-24 (×2): via INTRAVENOUS

## 2012-02-24 MED ORDER — HYDROCODONE-ACETAMINOPHEN 5-500 MG PO TABS: ORAL_TABLET | ORAL | Status: DC

## 2012-02-24 MED ORDER — NEOSTIGMINE METHYLSULFATE 1 MG/ML IJ SOLN
INTRAMUSCULAR | Status: DC | PRN
  Administered 2012-02-24: 4 mg via INTRAVENOUS

## 2012-02-24 MED ORDER — LACTATED RINGERS IV SOLN
INTRAVENOUS | Status: DC
  Administered 2012-02-24: 10:00:00 via INTRAVENOUS

## 2012-02-24 MED ORDER — MIDAZOLAM HCL 5 MG/5ML IJ SOLN
INTRAMUSCULAR | Status: DC | PRN
  Administered 2012-02-24: 2 mg via INTRAVENOUS

## 2012-02-24 MED ORDER — FENTANYL CITRATE 0.05 MG/ML IJ SOLN: 25.0000 ug | INTRAMUSCULAR | Status: DC | PRN

## 2012-02-24 MED ORDER — SODIUM CHLORIDE 0.9 % IV SOLN: Freq: Once | INTRAVENOUS | Status: DC

## 2012-02-24 MED ORDER — GLYCOPYRROLATE 0.2 MG/ML IJ SOLN
INTRAMUSCULAR | Status: DC | PRN
  Administered 2012-02-24: .8 mg via INTRAVENOUS

## 2012-02-24 MED ORDER — PROPOFOL 10 MG/ML IV EMUL
INTRAVENOUS | Status: DC | PRN
  Administered 2012-02-24: 110 mg via INTRAVENOUS

## 2012-02-24 SURGICAL SUPPLY — 24 items
BRUSH CYTOL CELLEBRITY 1.5X140 (MISCELLANEOUS) ×1 IMPLANT
CANISTER SUCTION 2500CC (MISCELLANEOUS) ×2 IMPLANT
CLOTH BEACON ORANGE TIMEOUT ST (SAFETY) ×2 IMPLANT
CONT SPEC 4OZ CLIKSEAL STRL BL (MISCELLANEOUS) ×2 IMPLANT
COVER TABLE BACK 60X90 (DRAPES) ×2 IMPLANT
FORCEPS BIOP RJ4 1.8 (CUTTING FORCEPS) IMPLANT
GLOVE BIO SURGEON STRL SZ7 (GLOVE) ×1 IMPLANT
GLOVE SURG SIGNA 7.5 PF LTX (GLOVE) ×2 IMPLANT
GOWN STRL NON-REIN LRG LVL3 (GOWN DISPOSABLE) ×1 IMPLANT
KIT ROOM TURNOVER OR (KITS) ×2 IMPLANT
MARKER SKIN DUAL TIP RULER LAB (MISCELLANEOUS) ×2 IMPLANT
NDL BIOPSY TRANSBRONCH 21G (NEEDLE) IMPLANT
NEEDLE BIOPSY TRANSBRONCH 21G (NEEDLE) IMPLANT
NEEDLE SYS SONOTIP II EBUSTBNA (NEEDLE) ×1 IMPLANT
NS IRRIG 1000ML POUR BTL (IV SOLUTION) ×2 IMPLANT
OIL SILICONE PENTAX (PARTS (SERVICE/REPAIRS)) IMPLANT
PAD ARMBOARD 7.5X6 YLW CONV (MISCELLANEOUS) ×4 IMPLANT
SPONGE GAUZE 4X4 12PLY (GAUZE/BANDAGES/DRESSINGS) IMPLANT
SYR 20CC LL (SYRINGE) ×2 IMPLANT
SYR 20ML ECCENTRIC (SYRINGE) ×4 IMPLANT
SYR 5ML LUER SLIP (SYRINGE) ×2 IMPLANT
TOWEL OR 17X24 6PK STRL BLUE (TOWEL DISPOSABLE) ×2 IMPLANT
TRAP SPECIMEN MUCOUS 40CC (MISCELLANEOUS) ×2 IMPLANT
TUBE CONNECTING 12X1/4 (SUCTIONS) ×2 IMPLANT

## 2012-02-24 NOTE — Anesthesia Procedure Notes (Signed)
Procedure Name: Intubation Date/Time: 02/24/2012 10:38 AM Performed by: Mancel Parsons Pre-anesthesia Checklist: Patient identified, Timeout performed, Emergency Drugs available and Suction available Patient Re-evaluated:Patient Re-evaluated prior to inductionOxygen Delivery Method: Circle system utilized Preoxygenation: Pre-oxygenation with 100% oxygen Intubation Type: IV induction Ventilation: Mask ventilation without difficulty Laryngoscope Size: Mac and 3 Grade View: Grade II Tube type: Oral Tube size: 8.5 mm Airway Equipment and Method: Stylet and LTA kit utilized Placement Confirmation: ETT inserted through vocal cords under direct vision,  positive ETCO2 and breath sounds checked- equal and bilateral Secured at: 21 cm Tube secured with: Tape Dental Injury: Teeth and Oropharynx as per pre-operative assessment

## 2012-02-24 NOTE — Anesthesia Postprocedure Evaluation (Signed)
  Anesthesia Post-op Note  Patient: Victoria Price  Procedure(s) Performed: Procedure(s) (LRB): VIDEO BRONCHOSCOPY WITH ENDOBRONCHIAL ULTRASOUND (N/A)  Patient Location: PACU  Anesthesia Type: General  Level of Consciousness: awake and alert   Airway and Oxygen Therapy: Patient Spontanous Breathing and Patient connected to nasal cannula oxygen  Post-op Pain: none  Post-op Assessment: Post-op Vital signs reviewed, Patient's Cardiovascular Status Stable, Respiratory Function Stable and Patent Airway  Post-op Vital Signs: Reviewed and stable  Complications: No apparent anesthesia complications

## 2012-02-24 NOTE — Telephone Encounter (Signed)
Call from pt reporting she is unable to tolerate Dilaudid-- causes nausea and makes her jittery. Requesting Vicodin refill for breakthrough pain. She reports she is needing less Vicodin since starting Oxycontin.  Reviewed with Misty Stanley, [place] to refill Vicodin. Rx called in, pt made aware.

## 2012-02-24 NOTE — H&P (View-Only) (Signed)
Subjective:    Patient ID: Victoria Price, female    DOB: November 16, 1956, 56 y.o.   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 She presented with constipation & meal related pains since jan' 13, underwent Ct abdomen 01/26/12 which showed a 5 cm mass in the pancreatic body . 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 The tumor abutted the posterior wall of the stomach . 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  Staging PET scan 02/12/2012 showed a hypermetabolic left lung mass -This extended inferiorly from the left hilum and is primarily within the superior segment of the lower lobe, although the lesion appears to cross the fissure centrally. This mass measures 5.1 x 5.2 cm transverse on image 80 and has an SUV max of 16.8. There was some adjacent postobstructive pneumonitis in the superior segment of the left lower lobe.The pancreatic mass & a peritoneal implant were noted to be  Hypermetabolic. Her main complaints now are abdominal pain - she has been started on dilaudid & oxycontin. She has lost about 20 lbs int he last 3 months. She denies cough, hemoptysis or wheezing She repors asthma since childhood which seems to be mild intermitent , has not required MDIs for at least the last 4 years  Past Medical History  Diagnosis Date  . Asthma   . Allergic rhinitis    Past Surgical History  Procedure Date  . Abdominal hysterectomy   . Hernia repair   . Tubal ligation   . Tonsillectomy   . Cesarean section   . Appendectomy   . Eus 01/29/2012    Procedure: UPPER ENDOSCOPIC ULTRASOUND (EUS) LINEAR;  Surgeon: Rob Bunting, MD;  Location: [facility];  Service: Endoscopy;  Laterality: N/A;  Pt. doesn't know she has CA.... Do not call today (2/12) - office is calling tomorrow.   Allergies  Allergen Reactions  . Dairy Digestive Swelling  .  Peanut-Containing Drug Products Itching  . Corn-Containing Products Itching  . Wheat Itching  . Chicken Allergy Rash  . Cinnamon Itching  . Citrus Rash  . Eggs Or Egg-Derived Products Rash  . Strawberry Rash      Review of Systems  Constitutional: Positive for unexpected weight change. Negative for fever.  HENT: Negative for ear pain, nosebleeds, congestion, sore throat, rhinorrhea, sneezing, trouble swallowing, dental problem, postnasal drip and sinus pressure.   Eyes: Negative for redness and itching.  Respiratory: Negative for cough, chest tightness, shortness of breath and wheezing.   Cardiovascular: Negative for palpitations and leg swelling.  Gastrointestinal: Positive for abdominal pain. Negative for nausea and vomiting.       Indigestion  Genitourinary: Negative for dysuria.  Musculoskeletal: Negative for joint swelling.  Skin: Negative for rash.  Neurological: Negative for headaches.  Hematological: Does not bruise/bleed easily.  Psychiatric/Behavioral: Negative for dysphoric mood. The patient is not nervous/anxious.        Objective:   Physical Exam  Gen. Pleasant, well-nourished, in no distress, normal affect ENT - no lesions, no post nasal drip Neck: No JVD, no thyromegaly, no carotid bruits Lungs: no use of accessory muscles, no dullness to percussion, clear without rales or rhonchi  Cardiovascular: Rhythm regular, heart sounds  normal, no murmurs or gallops, no peripheral edema Abdomen: soft and non-tender, no hepatosplenomegaly, BS normal. Musculoskeletal: No deformities, no cyanosis or clubbing Neuro:  alert, non focal  Assessment & Plan:

## 2012-02-24 NOTE — Transfer of Care (Signed)
Immediate Anesthesia Transfer of Care Note  Patient: Victoria Price  Procedure(s) Performed: Procedure(s) (LRB): VIDEO BRONCHOSCOPY WITH ENDOBRONCHIAL ULTRASOUND (N/A)  Patient Location: PACU  Anesthesia Type: General  Level of Consciousness: awake and alert   Airway & Oxygen Therapy: Patient Spontanous Breathing and Patient connected to nasal cannula oxygen  Post-op Assessment: Report given to PACU RN and Post -op Vital signs reviewed and stable  Post vital signs: Reviewed  Complications: No apparent anesthesia complications

## 2012-02-24 NOTE — Procedures (Signed)
Indication :Lt hilar mass in this 55 y.o. Smoker with pancreatic cancer (adenosquamous) - primary lung cancer vs met  Written informed consent was obtained prior to the procedure.  The risks of the procedure including coughing, bleeding and the small chance of lung puncture requiring chest tube were discussed in great detail. The benefits & alternatives including serial follow up were also discussed.  General anesthesia was provided & the pt was orally intubated .  Bronchoscope entered from the ETT, tip visualised 3 cm above carina.  Trachea & bronchial tree examined to the subsegmental level.  Endobronchial mass was noted at the LLL segmental bronchi partially obstructing the take off of LLL bronchus - see picture. Using endobronchial ultrasound, a large mass was visualised at the 10L level. 3-4 passes were made with FNA under EBUS guidance & the specimens sent for pathology.  EBUS was then withdrawn, blood clots were suctioned off. Using videobronchoscope, brushings were taken from the LLL endobronchial mass.  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  

## 2012-02-24 NOTE — Interval H&P Note (Signed)
History and Physical Interval Note:  02/24/2012 10:17 AM  Victoria Price  has presented today for surgery, with the diagnosis of lung mass  The various methods of treatment have been discussed with the patient and family. After consideration of risks, benefits and other options for treatment, the patient has consented to  Procedure(s) (LRB): VIDEO BRONCHOSCOPY WITH ENDOBRONCHIAL ULTRASOUND (Bilateral) as a surgical intervention .  The patients' history has been reviewed, patient examined, no change in status, stable for surgery.  I have reviewed the patients' chart and labs.  Questions were answered to the patient's satisfaction.     Thayne Cindric V.

## 2012-02-24 NOTE — Anesthesia Preprocedure Evaluation (Addendum)
Anesthesia Evaluation  Patient identified by MRN, date of birth, ID band Patient awake    Reviewed: Allergy & Precautions, H&P , NPO status , Patient's Chart, lab work & pertinent test results, reviewed documented beta blocker date and time   Airway Mallampati: II TM Distance: >3 FB Neck ROM: Full    Dental  (+) Teeth Intact and Dental Advisory Given   Pulmonary asthma , pneumonia , former smoker Lung CA breath sounds clear to auscultation        Cardiovascular Rhythm:Regular Rate:Normal     Neuro/Psych Anxiety    GI/Hepatic hiatal hernia,   Endo/Other  Pancreatic CA  Renal/GU      Musculoskeletal   Abdominal   Peds  Hematology   Anesthesia Other Findings   Reproductive/Obstetrics                          Anesthesia Physical Anesthesia Plan  ASA: III  Anesthesia Plan: General   Post-op Pain Management:    Induction: Intravenous  Airway Management Planned: Oral ETT  Additional Equipment:   Intra-op Plan:   Post-operative Plan: Extubation in OR  Informed Consent: I have reviewed the patients History and Physical, chart, labs and discussed the procedure including the risks, benefits and alternatives for the proposed anesthesia with the patient or authorized representative who has indicated his/her understanding and acceptance.   Dental advisory given  Plan Discussed with: CRNA  Anesthesia Plan Comments:         Anesthesia Quick Evaluation

## 2012-02-24 NOTE — Preoperative (Signed)
Beta Blockers   Reason not to administer Beta Blockers:Not Applicable. No home beta blockers 

## 2012-02-24 NOTE — Op Note (Signed)
Indication :Lt hilar mass in this 56 y.o. Smoker with pancreatic cancer (adenosquamous) - primary lung cancer vs met  Written informed consent was obtained prior to the procedure.  The risks of the procedure including coughing, bleeding and the small chance of lung puncture requiring chest tube were discussed in great detail. The benefits & alternatives including serial follow up were also discussed.  General anesthesia was provided & the pt was orally intubated .  Bronchoscope entered from the ETT, tip visualised 3 cm above carina.  Trachea & bronchial tree examined to the subsegmental level.  Endobronchial mass was noted at the LLL segmental bronchi partially obstructing the take off of LLL bronchus - see picture. Using endobronchial ultrasound, a large mass was visualised at the 10L level. 3-4 passes were made with FNA under EBUS guidance & the specimens sent for pathology.  EBUS was then withdrawn, blood clots were suctioned off. Using videobronchoscope, brushings were taken from the LLL endobronchial mass.  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

## 2012-02-26 ENCOUNTER — Encounter: Payer: Self-pay | Admitting: Emergency Medicine

## 2012-02-26 ENCOUNTER — Ambulatory Visit (INDEPENDENT_AMBULATORY_CARE_PROVIDER_SITE_OTHER)
Admission: RE | Admit: 2012-02-26 | Discharge: 2012-02-26 | Disposition: A | Discharge status: Home / Self Care | Payer: BC Managed Care – PPO | Source: Ambulatory Visit | Attending: Emergency Medicine | Admitting: Emergency Medicine

## 2012-02-26 ENCOUNTER — Ambulatory Visit (INDEPENDENT_AMBULATORY_CARE_PROVIDER_SITE_OTHER): Payer: BC Managed Care – PPO | Admitting: Emergency Medicine

## 2012-02-26 ENCOUNTER — Telehealth: Payer: Self-pay | Admitting: Emergency Medicine

## 2012-02-26 VITALS — BP 140/80 | HR 63 | Temp 97.8°F | Ht 61.0 in | Wt 176.0 lb

## 2012-02-26 DIAGNOSIS — R918 Other nonspecific abnormal finding of lung field: Secondary | ICD-10-CM

## 2012-02-26 DIAGNOSIS — R222 Localized swelling, mass and lump, trunk: Secondary | ICD-10-CM

## 2012-02-26 NOTE — Telephone Encounter (Signed)
Please call patient to let her know that her CXR 3/15 did not show any new abnormalities, no PNA. Thanks.

## 2012-02-26 NOTE — Progress Notes (Signed)
  Subjective:    Patient ID: Victoria Price, female    DOB: [birth year], 56 y.o.   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 She presented with constipation & meal related pains since jan' 13, underwent Ct abdomen 01/26/12 which showed a 5 cm mass in the pancreatic body . 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 The tumor abutted the posterior wall of the stomach . 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  Staging PET scan 02/12/2012 showed a hypermetabolic left lung mass -This extended inferiorly from the left hilum and is primarily within the superior segment of the lower lobe, although the lesion appears to cross the fissure centrally. This mass measures 5.1 x 5.2 cm transverse on image 80 and has an SUV max of 16.8. There was some adjacent postobstructive pneumonitis in the superior segment of the left lower lobe.The pancreatic mass & a peritoneal implant were noted to be  Hypermetabolic. Her main complaints now are abdominal pain - she has been started on dilaudid & oxycontin. She has lost about 20 lbs int he last 3 months. She denies cough, hemoptysis or wheezing She repors asthma since childhood which seems to be mild intermitent , has not required MDIs for at least the last 4 years  ROV 02/26/12 -- acute visit, underwent FOB + EBUS by Dr Vassie Loll on 3/13, bx's showed small cell CA. She knows that she has f/u already arranged w Dr Truett Perna. She is now experiencing chest phlegm and rattling when she lays down and in the am when she gets up. Sh ehas coughed up a small amt greenish phleghm but usually dry. No fevers. No other URI sx.      Objective:   Physical Exam Filed Vitals:   02/26/12 1500  BP: 140/80  Pulse: 63  Temp: 97.8 F (36.6 C)   Gen. Pleasant, well-nourished, in no distress, normal affect ENT - no lesions, no post nasal drip Neck: No  JVD, no thyromegaly, no carotid bruits Lungs: no use of accessory muscles, no dullness to percussion, clear without rales or rhonchi  Cardiovascular: Rhythm regular, heart sounds  normal, no murmurs or gallops, no peripheral edema Abdomen: soft and non-tender, no hepatosplenomegaly, BS normal. Musculoskeletal: No deformities, no cyanosis or clubbing Neuro:  alert, non focal      Assessment & Plan:  Lung mass S/p FOB/EBUS - I think this is typical cough after a procedure, doesn't seem to have brponchitis or PNA.  - will check a CXR today to insure no infiltrate - f/u w Dr Truett Perna as planned.  - call us if sx change

## 2012-02-26 NOTE — Patient Instructions (Signed)
CXR today Please follow up with Dr Truett Perna as planned

## 2012-02-26 NOTE — Assessment & Plan Note (Signed)
S/p FOB/EBUS - I think this is typical cough after a procedure, doesn't seem to have brponchitis or PNA.  - will check a CXR today to insure no infiltrate - f/u w Dr Truett Perna as planned.  - call us if sx change

## 2012-02-29 NOTE — Telephone Encounter (Signed)
Pt notified of CXR results per Dr. Delton Coombes and verbalized understanding.

## 2012-03-02 ENCOUNTER — Ambulatory Visit (HOSPITAL_BASED_OUTPATIENT_CLINIC_OR_DEPARTMENT_OTHER): Payer: BC Managed Care – PPO | Admitting: Oncology

## 2012-03-02 VITALS — BP 120/78 | HR 83 | Temp 96.9°F | Ht 61.0 in | Wt 172.0 lb

## 2012-03-02 DIAGNOSIS — R52 Pain, unspecified: Secondary | ICD-10-CM

## 2012-03-02 DIAGNOSIS — C3430 Malignant neoplasm of lower lobe, unspecified bronchus or lung: Secondary | ICD-10-CM

## 2012-03-02 DIAGNOSIS — C7889 Secondary malignant neoplasm of other digestive organs: Secondary | ICD-10-CM

## 2012-03-02 DIAGNOSIS — R918 Other nonspecific abnormal finding of lung field: Secondary | ICD-10-CM

## 2012-03-02 DIAGNOSIS — R110 Nausea: Secondary | ICD-10-CM

## 2012-03-02 DIAGNOSIS — C3490 Malignant neoplasm of unspecified part of unspecified bronchus or lung: Secondary | ICD-10-CM

## 2012-03-02 MED ORDER — LIDOCAINE-PRILOCAINE 2.5-2.5 % EX CREA: TOPICAL_CREAM | CUTANEOUS | Status: DC | PRN

## 2012-03-03 ENCOUNTER — Other Ambulatory Visit: Payer: BC Managed Care – PPO

## 2012-03-03 ENCOUNTER — Encounter: Payer: Self-pay | Admitting: *Deleted

## 2012-03-03 ENCOUNTER — Encounter (HOSPITAL_COMMUNITY): Payer: Self-pay | Admitting: Pharmacy Technician

## 2012-03-03 ENCOUNTER — Telehealth: Payer: Self-pay | Admitting: Oncology

## 2012-03-03 NOTE — Progress Notes (Signed)
OFFICE PROGRESS NOTE   INTERVAL HISTORY:   She returns as scheduled. She continues to have left-sided abdominal discomfort. The pain has improved since starting OxyContin. She underwent a bronchoscopy on 02/24/2012 by Dr. Vassie Loll. An endobronchial mass was noted at the left lower lobe. A mass was noted at the 10L level on endobronchial ultrasound. Both masses were biopsied. The cytology from both the left hilar mass and left endobronchial mass revealed small cell carcinoma.  Objective:  Vital signs in last 24 hours:  Blood pressure 120/78, pulse 83, temperature 96.9 F (36.1 C), temperature source Oral, height 5\' 1"  (1.549 m), weight 172 lb (78.019 kg).    HEENT: Neck without mass Resp: Lungs clear bilaterally Cardio: Regular rate and rhythm GI: Nontender, no hepatosplenomegaly, no mass Vascular: No leg edema    Medications: I have reviewed the patient's current medications.  Assessment/Plan: 1.Locally advanced pancreas cancer-adenosquamous carcinoma, presenting with a pancreas body mass and CT/ultrasound evidence of portal vein/SMV invasion.  2. Pain secondary to the locally advanced pancreas mass -improved with OxyContin 3. Nausea-? Related to tumor abutting the stomach  4. Constipation  5. Staging PET scan 02/12/2012 confirming a hypermetabolic left lung mass, hypermetabolic pancreas mass, hypermetabolic peritoneal implant. -Status post a bronchoscopy confirming a left endobronchial mass and left hilar mass on 02/24/2012. Biopsy of both areas confirmed small cell carcinoma.    Disposition:  She has been diagnosed with a locally advanced adenosquamous carcinoma of the pancreas and small cell lung cancer. She appears to have distinct primary tumors. She is symptomatic with pain and nausea related to the pancreas mass. There is evidence for a peritoneal implant.  Her case was presented at the GI tumor conference earlier today. The consensus opinion was to proceed with systemic  therapy. We will reserve radiation for palliation of the lung mass or pain related to the pancreas mass.  I recommend FOLFOX/FOLFIRINOX for treatment of the pancreas cancer. I am hopeful this regimen will also have activity against the small cell lung cancer. We will monitor her clinical status closely and obtain a chest x-ray after 2-3 cycles of systemic therapy.  I reviewed the potential toxicities associated with the FOLFOX regimen in detail with the patient and her family. She understands the chance for nausea/vomiting, mucositis, diarrhea, alopecia, and hematologic toxicity. We discussed the hand/foot syndrome and skin hyperpigmentation associated with 5-fluorouracil. We discussed the various types of neuropathy associated with oxaliplatin.  She will be referred for placement of a Port-A-Cath and she will attend chemotherapy teaching class. The plan is to begin a first cycle of chemotherapy within the next one week. She will be scheduled for an office visit prior to cycle #2.   Lucile Shutters, MD  03/03/2012  4:55 PM

## 2012-03-03 NOTE — Telephone Encounter (Signed)
called ct and changed ct to ct head and cx ct chest,called pt to verify (with Gina) appts for 3/22.  Pt states will be taking an allergy pill as contrast made her mouth swell sopme last time and allergy pill helped when she took it afterwards.

## 2012-03-04 ENCOUNTER — Ambulatory Visit: Payer: BC Managed Care – PPO | Admitting: Nutrition

## 2012-03-04 ENCOUNTER — Ambulatory Visit (HOSPITAL_COMMUNITY)
Admission: RE | Admit: 2012-03-04 | Discharge: 2012-03-04 | Disposition: A | Discharge status: Home / Self Care | Payer: BC Managed Care – PPO | Source: Ambulatory Visit | Attending: Oncology | Admitting: Oncology

## 2012-03-04 ENCOUNTER — Other Ambulatory Visit: Payer: Self-pay | Admitting: Radiology

## 2012-03-04 ENCOUNTER — Other Ambulatory Visit (HOSPITAL_COMMUNITY): Payer: BC Managed Care – PPO

## 2012-03-04 ENCOUNTER — Telehealth: Payer: Self-pay | Admitting: *Deleted

## 2012-03-04 ENCOUNTER — Telehealth: Payer: Self-pay | Admitting: Oncology

## 2012-03-04 DIAGNOSIS — R918 Other nonspecific abnormal finding of lung field: Secondary | ICD-10-CM

## 2012-03-04 DIAGNOSIS — C259 Malignant neoplasm of pancreas, unspecified: Secondary | ICD-10-CM | POA: Insufficient documentation

## 2012-03-04 DIAGNOSIS — R599 Enlarged lymph nodes, unspecified: Secondary | ICD-10-CM | POA: Insufficient documentation

## 2012-03-04 DIAGNOSIS — C3490 Malignant neoplasm of unspecified part of unspecified bronchus or lung: Secondary | ICD-10-CM

## 2012-03-04 DIAGNOSIS — R222 Localized swelling, mass and lump, trunk: Secondary | ICD-10-CM | POA: Insufficient documentation

## 2012-03-04 MED ORDER — IOHEXOL 300 MG/ML  SOLN
100.0000 mL | Freq: Once | INTRAMUSCULAR | Status: AC | PRN
  Administered 2012-03-04: 100 mL via INTRAVENOUS

## 2012-03-04 NOTE — Progress Notes (Signed)
Ms. Roanhorse presents to nutrition followup with her mother.  She continues to have weight loss, and weight was documented as 172 pounds on March 20th, which is down from 186 pounds on February 15th.  The patient continues to have intolerance and allergies to a large variety of foods.  She does tolerate some of the juice-based supplements, and she has been drinking some of these.  She brought a food journal today, which helped me to understand the small amount of food she has been able to eat.  She is interested in trying to build some variety into her diet.  NUTRITION DIAGNOSIS:  Unintended weight loss continues.  INTERVENTION:  I have spent quite a bit of time today discussing ways patient could incorporate more protein foods into her daily meals and snacks.  I have encouraged her to begin to make some lists of foods that she tolerates based on basic food groups such as proteins, fruits, vegetables and starches.  She will be able to list the foods she tolerates that fit into these categories and then she will be able to build a meal and a snack by choosing 1 food from each list. Patient was encouraged to continue to consume oral nutritional supplements throughout the day as tolerated.  I provided her with some additional samples and some fact sheets for her to refer to.  I have answered her questions.  MONITORING, EVALUATION AND GOALS:  The patient has been unable to increase her oral intake to minimize weight loss.  She will continue to work to increase oral intake to maintain present weight.  NEXT VISIT:  There is no followup scheduled.  I will continue to work with the patient as needed, and she would prefer to call me with her questions and concerns.    ______________________________ Zenovia Jarred, RD, LDN Clinical Nutrition Specialist BN/MEDQ  D:  03/04/2012  T:  03/04/2012  Job:  [id number]

## 2012-03-04 NOTE — Telephone Encounter (Signed)
made patient appointment for ct head on 03-04-2012 made patient appointment for port placed on 03-08-2012 at 9:30am 03-12-2012 pump and start 03-23-2012 lab md treatment

## 2012-03-04 NOTE — Telephone Encounter (Signed)
called pt [facility] for appts on 03/28.  asked pt to pick march and april appts at t that time

## 2012-03-07 ENCOUNTER — Telehealth: Payer: Self-pay | Admitting: Pulmonary Disease

## 2012-03-07 NOTE — Telephone Encounter (Signed)
Ok to cancel & make appt prn

## 2012-03-07 NOTE — Telephone Encounter (Signed)
Called and spoke with pt. She is scheduled to see RA for rov on 03/10/12 and needs to know if she needs to keep this appt. She states that she has improved since her last visit when she saw RB. She is also scheduled to have her first chemo tx on 03/10/12 and does not know if she will feel up to coming here. Please advise if ok to cancel her appt, and if so when does she need to come back. Thanks!

## 2012-03-07 NOTE — Telephone Encounter (Signed)
Called and spoke with pt. Informed her of RA's response. Pt verbalized understanding and pending appt was cancelled.

## 2012-03-08 ENCOUNTER — Telehealth: Payer: Self-pay | Admitting: *Deleted

## 2012-03-08 ENCOUNTER — Ambulatory Visit (HOSPITAL_COMMUNITY)
Admission: RE | Admit: 2012-03-08 | Discharge: 2012-03-08 | Disposition: A | Discharge status: Home / Self Care | Payer: BC Managed Care – PPO | Source: Ambulatory Visit | Attending: Oncology | Admitting: Oncology

## 2012-03-08 ENCOUNTER — Other Ambulatory Visit: Payer: Self-pay | Admitting: Oncology

## 2012-03-08 ENCOUNTER — Telehealth: Payer: Self-pay | Admitting: Oncology

## 2012-03-08 DIAGNOSIS — J45909 Unspecified asthma, uncomplicated: Secondary | ICD-10-CM | POA: Insufficient documentation

## 2012-03-08 DIAGNOSIS — R918 Other nonspecific abnormal finding of lung field: Secondary | ICD-10-CM

## 2012-03-08 DIAGNOSIS — Z79899 Other long term (current) drug therapy: Secondary | ICD-10-CM | POA: Insufficient documentation

## 2012-03-08 DIAGNOSIS — C259 Malignant neoplasm of pancreas, unspecified: Secondary | ICD-10-CM | POA: Insufficient documentation

## 2012-03-08 DIAGNOSIS — C3490 Malignant neoplasm of unspecified part of unspecified bronchus or lung: Secondary | ICD-10-CM

## 2012-03-08 MED ORDER — MIDAZOLAM HCL 5 MG/5ML IJ SOLN
INTRAMUSCULAR | Status: AC | PRN
  Administered 2012-03-08 (×2): 2 mg via INTRAVENOUS

## 2012-03-08 MED ORDER — HYDROMORPHONE HCL PF 1 MG/ML IJ SOLN
INTRAMUSCULAR | Status: AC | PRN
  Administered 2012-03-08 (×2): 1 mg via INTRAVENOUS

## 2012-03-08 MED ORDER — CEFAZOLIN SODIUM 1-5 GM-% IV SOLN
INTRAVENOUS | Status: AC
  Filled 2012-03-08: qty 50

## 2012-03-08 MED ORDER — FENTANYL CITRATE 0.05 MG/ML IJ SOLN
INTRAMUSCULAR | Status: AC | PRN
  Administered 2012-03-08 (×2): 100 ug via INTRAVENOUS

## 2012-03-08 MED ORDER — FENTANYL CITRATE 0.05 MG/ML IJ SOLN
INTRAMUSCULAR | Status: AC
  Filled 2012-03-08: qty 4

## 2012-03-08 MED ORDER — SODIUM CHLORIDE 0.9 % IV SOLN
Freq: Once | INTRAVENOUS | Status: AC
  Administered 2012-03-08: 08:00:00 via INTRAVENOUS

## 2012-03-08 MED ORDER — CEFAZOLIN SODIUM 1-5 GM-% IV SOLN
1.0000 g | INTRAVENOUS | Status: AC
  Administered 2012-03-08: 1 g via INTRAVENOUS

## 2012-03-08 MED ORDER — MIDAZOLAM HCL 2 MG/2ML IJ SOLN
INTRAMUSCULAR | Status: AC
  Filled 2012-03-08: qty 2

## 2012-03-08 MED ORDER — FENTANYL CITRATE 0.05 MG/ML IJ SOLN
INTRAMUSCULAR | Status: AC
  Filled 2012-03-08: qty 2

## 2012-03-08 MED ORDER — HEPARIN SOD (PORK) LOCK FLUSH 100 UNIT/ML IV SOLN
500.0000 [IU] | Freq: Once | INTRAVENOUS | Status: AC
  Administered 2012-03-08: 500 [IU] via INTRAVENOUS

## 2012-03-08 MED ORDER — HYDROMORPHONE HCL PF 2 MG/ML IJ SOLN
INTRAMUSCULAR | Status: AC
Start: 2012-03-08 — End: 2012-03-08
  Filled 2012-03-08: qty 1

## 2012-03-08 NOTE — Telephone Encounter (Signed)
Left VM that radiology result of 3/22 was normal.

## 2012-03-08 NOTE — Procedures (Signed)
Successful placement of right jugular approach port-a-cath with tip at superior aspect of the right atrium. The catheter is ready for immediate use. No immediate post procedural complications.  

## 2012-03-08 NOTE — H&P (Signed)
Victoria Price is an 56 y.o. female.   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  Past Medical History  Diagnosis Date  . Asthma   . Allergic rhinitis   . Pneumonia     hx of  . Bronchitis     hx  . Thyroid disease   . Anemia     hx of  . H/O hiatal hernia   . Cancer     lung ca, pancrease ca  . Nausea     "unable to eat alot"  . Anxiety     Past Surgical History  Procedure Date  . Abdominal hysterectomy   . Hernia repair   . Tubal ligation   . Tonsillectomy   . Cesarean section   . Appendectomy   . Eus 01/29/2012    Procedure: UPPER ENDOSCOPIC ULTRASOUND (EUS) LINEAR;  Surgeon: Rob Bunting, MD;  Location: [facility];  Service: Endoscopy;  Laterality: N/A;  Pt. doesn't know she has CA.... Do not call today (2/12) - office is calling tomorrow.  . Wisdom tooth extraction     Family History  Problem Relation Age of Onset  . Skin cancer Father   . Lung cancer Sister   . Prostate cancer Maternal Uncle    Social History:  reports that she quit smoking about 5 years ago. Her smoking use included Cigarettes. She has a 45 pack-year smoking history. She has never used smokeless tobacco. She reports that she drinks about .5 ounces of alcohol per week. She reports that she does not use illicit drugs.  Allergies:  Allergies  Allergen Reactions  . Dairy Digestive Swelling  . Peanut-Containing Drug Products Itching  . Corn-Containing Products Itching  . Wheat Itching  . Chicken Allergy Rash  . Cinnamon Itching  . Citrus Rash  . Eggs Or Egg-Derived Products Rash  . Strawberry Rash    Medications Prior to Admission  Medication Sig Dispense Refill  . ALPRAZolam (XANAX) 0.25 MG tablet Take 0.25 mg by mouth at bedtime as needed. For sleep.      . Chlorpheniramine-Phenylephrine 3.5-10 MG TABS Take 1 tablet by mouth  daily as needed. For allergies.      [name] EPINEPHrine (EPIPEN) 0.3 mg/0.3 mL DEVI Inject 0.3 mg into the muscle once. For anaphylaxis.      . Estradiol Acetate 0.05 MG/24HR RING Place 0.05 each vaginally every 3 (three) months. Last insert 02/08/2012.      . fish oil-omega-3 fatty acids 1000 MG capsule Take 1 g by mouth daily.       . fluticasone (FLONASE) 50 MCG/ACT nasal spray Place 2 sprays into the nose daily as needed. For congestion.      [name] HYDROcodone-acetaminophen (VICODIN) 5-500 MG per tablet Take 1 tablet by mouth every 6 (six) hours as needed. Pain       . levothyroxine (SYNTHROID, LEVOTHROID) 88 MCG tablet Take 44 mcg by mouth daily before breakfast.       . lidocaine-prilocaine (EMLA) cream Apply topically as needed. To port-a-cath one hour prior to chemo.  30 g  0  . oxyCODONE (OXYCONTIN) 20 MG 12 hr tablet Take 20 mg by mouth every 12 (twelve) hours.      Bertram Gala Glycol-Propyl Glycol (SYSTANE ULTRA OP) Place 1 drop into both eyes 2 (two) times daily.       . polyethylene  glycol (MIRALAX / GLYCOLAX) packet Take 17 g by mouth daily.      . prochlorperazine (COMPAZINE) 10 MG tablet Take 10 mg by mouth every 6 (six) hours as needed. For nausea.       Medications Prior to Admission  Medication Dose Route Frequency Provider Last Rate Last Dose  . 0.9 %  sodium chloride infusion   Intravenous Once Brayton El, PA      . ceFAZolin (ANCEF) 1-5 GM-% IVPB           . ceFAZolin (ANCEF) IVPB 1 g/50 mL premix  1 g Intravenous to XRAY Brayton El, PA       Results for orders placed during the hospital encounter of 02/24/12  BASIC METABOLIC PANEL      Component Value Range   Sodium 139  135 - 145 (mEq/L)   Potassium 3.9  3.5 - 5.1 (mEq/L)   Chloride 101  96 - 112 (mEq/L)   CO2 28  19 - 32 (mEq/L)   Glucose, Bld 117 (*) 70 - 99 (mg/dL)   BUN 4 (*) 6 - 23 (mg/dL)   Creatinine, Ser 1.61  0.50 - 1.10 (mg/dL)   Calcium 9.7  8.4 - 09.6 (mg/dL)   GFR calc non Af Amer >90  >90 (mL/min)    GFR calc Af Amer >90  >90 (mL/min)  CBC      Component Value Range   WBC 5.5  4.0 - 10.5 (K/uL)   RBC 4.20  3.87 - 5.11 (MIL/uL)   Hemoglobin 12.2  12.0 - 15.0 (g/dL)   HCT 04.5  40.9 - 81.1 (%)   MCV 86.7  78.0 - 100.0 (fL)   MCH 29.0  26.0 - 34.0 (pg)   MCHC 33.5  30.0 - 36.0 (g/dL)   RDW 91.4  78.2 - 95.6 (%)   Platelets 215  150 - 400 (K/uL)     Review of Systems  Constitutional: Negative for fever and chills.  Respiratory: Negative for cough and shortness of breath.   Cardiovascular: Negative for chest pain.  Gastrointestinal: Positive for nausea and abdominal pain. Negative for vomiting.  Musculoskeletal: Positive for back pain.  Neurological: Negative for headaches.  Endo/Heme/Allergies: Does not bruise/bleed easily.   Filed Vitals:   03/08/12 0802 03/08/12 0822  BP:  126/72  Pulse:  85  Temp: 98 F (36.7 C)   Resp:  21  SpO2:  97%   Physical Exam  Constitutional: She is oriented to person, place, and time. She appears well-developed and well-nourished.  Cardiovascular: Normal rate and regular rhythm.   Respiratory: Effort normal and breath sounds normal.  GI: Soft. Bowel sounds are normal. There is tenderness.  Musculoskeletal: Normal range of motion. She exhibits no edema.  Neurological: She is alert and oriented to person, place, and time.     Assessment/Plan Patient with adenosquamous carcinoma of pancreas, small cell carcinoma of lung; plan is for port a cath placement for chemotherapy.  Victoria Price,D KEVIN 03/08/2012, 8:18 AM

## 2012-03-08 NOTE — Telephone Encounter (Signed)
Message copied by Wandalee Ferdinand on Tue Mar 08, 2012  5:43 PM ------      Message from: Ladene Artist      Created: Sun Mar 06, 2012  9:09 PM       Please call patient, CT brain is negative

## 2012-03-08 NOTE — Telephone Encounter (Signed)
pts husband came by and picked up march and april schedule

## 2012-03-08 NOTE — Discharge Instructions (Signed)

## 2012-03-08 NOTE — ED Notes (Signed)
Pt.'s pre procedure pain has been constant throughout port placement. She has difficulty laying still. Medication doses up to this point has calmed her slightly.

## 2012-03-10 ENCOUNTER — Encounter: Payer: Self-pay | Admitting: Oncology

## 2012-03-10 ENCOUNTER — Ambulatory Visit: Payer: BC Managed Care – PPO | Admitting: Pulmonary Disease

## 2012-03-10 ENCOUNTER — Other Ambulatory Visit: Payer: Self-pay | Admitting: Oncology

## 2012-03-10 ENCOUNTER — Ambulatory Visit (HOSPITAL_BASED_OUTPATIENT_CLINIC_OR_DEPARTMENT_OTHER): Payer: BC Managed Care – PPO

## 2012-03-10 ENCOUNTER — Other Ambulatory Visit: Payer: Self-pay | Admitting: *Deleted

## 2012-03-10 VITALS — BP 131/69 | HR 76 | Temp 98.4°F

## 2012-03-10 DIAGNOSIS — C3490 Malignant neoplasm of unspecified part of unspecified bronchus or lung: Secondary | ICD-10-CM

## 2012-03-10 DIAGNOSIS — Z5111 Encounter for antineoplastic chemotherapy: Secondary | ICD-10-CM

## 2012-03-10 DIAGNOSIS — C259 Malignant neoplasm of pancreas, unspecified: Secondary | ICD-10-CM

## 2012-03-10 MED ORDER — OXALIPLATIN CHEMO INJECTION 100 MG/20ML
85.0000 mg/m2 | Freq: Once | INTRAVENOUS | Status: AC
  Administered 2012-03-10: 155 mg via INTRAVENOUS
  Filled 2012-03-10: qty 31

## 2012-03-10 MED ORDER — DEXTROSE 5 % IV SOLN
Freq: Once | INTRAVENOUS | Status: AC
  Administered 2012-03-10: 09:00:00 via INTRAVENOUS

## 2012-03-10 MED ORDER — SODIUM CHLORIDE 0.9 % IJ SOLN
10.0000 mL | INTRAMUSCULAR | Status: DC | PRN
  Filled 2012-03-10: qty 10

## 2012-03-10 MED ORDER — DEXAMETHASONE SODIUM PHOSPHATE 10 MG/ML IJ SOLN
10.0000 mg | Freq: Once | INTRAMUSCULAR | Status: AC
  Administered 2012-03-10: 10 mg via INTRAVENOUS

## 2012-03-10 MED ORDER — FLUOROURACIL CHEMO INJECTION 2.5 GM/50ML
400.0000 mg/m2 | Freq: Once | INTRAVENOUS | Status: AC
  Administered 2012-03-10: 750 mg via INTRAVENOUS
  Filled 2012-03-10: qty 15

## 2012-03-10 MED ORDER — SODIUM CHLORIDE 0.9 % IV SOLN
2400.0000 mg/m2 | INTRAVENOUS | Status: DC
  Administered 2012-03-10: 4400 mg via INTRAVENOUS
  Filled 2012-03-10: qty 88

## 2012-03-10 MED ORDER — HEPARIN SOD (PORK) LOCK FLUSH 100 UNIT/ML IV SOLN
500.0000 [IU] | Freq: Once | INTRAVENOUS | Status: DC | PRN
  Filled 2012-03-10: qty 5

## 2012-03-10 MED ORDER — ALPRAZOLAM 0.5 MG PO TABS: ORAL_TABLET | ORAL | Status: DC

## 2012-03-10 MED ORDER — OXYCODONE HCL 5 MG PO TABS: ORAL_TABLET | ORAL | Status: DC

## 2012-03-10 MED ORDER — LEUCOVORIN CALCIUM INJECTION 350 MG
409.5000 mg/m2 | Freq: Once | INTRAVENOUS | Status: AC
  Administered 2012-03-10: 750 mg via INTRAVENOUS
  Filled 2012-03-10: qty 37.5

## 2012-03-10 MED ORDER — ONDANSETRON 8 MG/50ML IVPB (CHCC)
8.0000 mg | Freq: Once | INTRAVENOUS | Status: AC
  Administered 2012-03-10: 8 mg via INTRAVENOUS

## 2012-03-10 NOTE — Patient Instructions (Signed)
[facility] Discharge Instructions for Patients Receiving Chemotherapy  Today you received the following chemotherapy agents Oxaliplatin, Leucovorian, and 5FU.  To help prevent nausea and vomiting after your treatment, we encourage you to take your nausea medication as prescribed by your physician.  If you develop nausea and vomiting that is not controlled by your nausea medication, call the clinic. If it is after clinic hours your family physician or the after hours number for the clinic or go to the Emergency Department.   BELOW ARE SYMPTOMS THAT SHOULD BE REPORTED IMMEDIATELY:  *FEVER GREATER THAN 100.5 F  *CHILLS WITH OR WITHOUT FEVER  NAUSEA AND VOMITING THAT IS NOT CONTROLLED WITH YOUR NAUSEA MEDICATION  *UNUSUAL SHORTNESS OF BREATH  *UNUSUAL BRUISING OR BLEEDING  TENDERNESS IN MOUTH AND THROAT WITH OR WITHOUT PRESENCE OF ULCERS  *URINARY PROBLEMS  *BOWEL PROBLEMS  UNUSUAL RASH Items with * indicate a potential emergency and should be followed up as soon as possible.  One of the nurses will contact you 24 hours after your treatment. Please let the nurse know about any problems that you may have experienced. Feel free to call the clinic you have any questions or concerns. The clinic phone number is [phone].   Return on Saturday, March 30 @                For pump stop appointment.   I have been informed and understand all the instructions given to me. I know to contact the clinic, my physician, or go to the Emergency Department if any problems should occur. I do not have any questions at this time, but understand that I may call the clinic during office hours   should I have any questions or need assistance in obtaining follow up care.    __________________________________________  _____________  __________ Signature of Patient or Authorized Representative            Date                   Time    __________________________________________ Nurse's  Signature

## 2012-03-10 NOTE — Progress Notes (Signed)
1st dose Oxaliplatin and Leucovoiran started, patient educated on signs and symptoms of reactions. Verbalizes understanding. DS Patient home with spill kit, and instructions. Understand pump and follow up.

## 2012-03-10 NOTE — Progress Notes (Signed)
Patient approve for 80% Discount  03/10/12 - 09/10/12

## 2012-03-10 NOTE — Telephone Encounter (Signed)
Left message on voicemail informing pt that rx is ready to be picked up. Xanax dose increased (pt reported she had to repeat dose to help her sleep) Dr. Truett Perna is changing breakthrough pain med to Oxycodone. (Hydrocodone ineffective, Dilaudid was discontinued due to nausea)

## 2012-03-11 ENCOUNTER — Other Ambulatory Visit: Payer: Self-pay

## 2012-03-11 ENCOUNTER — Telehealth: Payer: Self-pay

## 2012-03-11 DIAGNOSIS — C801 Malignant (primary) neoplasm, unspecified: Secondary | ICD-10-CM

## 2012-03-11 MED ORDER — DEXAMETHASONE 4 MG PO TABS: 8.0000 mg | ORAL_TABLET | Freq: Two times a day (BID) | ORAL | Status: AC

## 2012-03-11 MED ORDER — LORAZEPAM 1 MG PO TABS: 1.0000 mg | ORAL_TABLET | Freq: Four times a day (QID) | ORAL | Status: AC | PRN

## 2012-03-11 NOTE — Telephone Encounter (Signed)
Received return call from pt reporting she has had 3 more episodes of vomiting today (after 1st FOLFOX yesterday).  Pt reports she is able to continue to drink "diluted tea," but has only eaten "half a banana" since we spoke this am.   Spoke with Dr Truett Perna who ordered Ativan & Dexamethasone. meds called to [facility].   Pt aware of scripts called in & reviewed instructions.  Also instructed pt NOT to take Xanax while taking Ativan.  Aware she may require IVF tomorrow if she continues to have vomiting.    Val, RN working weekend notified. dph

## 2012-03-11 NOTE — Telephone Encounter (Signed)
Contacted pt re: 1st FOLFOX yesterday.  Pt reports she had one episode of vomiting last pm, but is feeling better this am.  Reports she ate "toast, beef broth, and had some diluted tea" before the call.  Pt has taken Compazine and she does think it is working for nausea.  Pt reports her pain to be "much better since he gave me the new prescription."  Pt did have difficulty sleeping last night, but "am going to lay dow for a nap now."   Spoke with Dr Truett Perna re: additional anti-emetics.  Per Dr Truett Perna, pt to contact his nurse with update this afternoon before prescribing anything. Pt verbalizes understanding. dph

## 2012-03-12 ENCOUNTER — Ambulatory Visit (HOSPITAL_BASED_OUTPATIENT_CLINIC_OR_DEPARTMENT_OTHER): Payer: BC Managed Care – PPO

## 2012-03-12 VITALS — BP 128/76 | HR 100 | Temp 98.2°F

## 2012-03-12 DIAGNOSIS — C259 Malignant neoplasm of pancreas, unspecified: Secondary | ICD-10-CM

## 2012-03-12 DIAGNOSIS — Z452 Encounter for adjustment and management of vascular access device: Secondary | ICD-10-CM

## 2012-03-12 MED ORDER — HEPARIN SOD (PORK) LOCK FLUSH 100 UNIT/ML IV SOLN
500.0000 [IU] | Freq: Once | INTRAVENOUS | Status: AC | PRN
  Administered 2012-03-12: 500 [IU]
  Filled 2012-03-12: qty 5

## 2012-03-12 MED ORDER — SODIUM CHLORIDE 0.9 % IJ SOLN
10.0000 mL | INTRAMUSCULAR | Status: DC | PRN
  Administered 2012-03-12: 10 mL
  Filled 2012-03-12: qty 10

## 2012-03-15 ENCOUNTER — Telehealth: Payer: Self-pay | Admitting: *Deleted

## 2012-03-15 NOTE — Telephone Encounter (Signed)
Left message thinking she may have yeast infection

## 2012-03-16 ENCOUNTER — Telehealth: Payer: Self-pay | Admitting: *Deleted

## 2012-03-16 NOTE — Telephone Encounter (Signed)
Having burning and itching and dryness in vaginal area. No discharge or odor noted. Does not think she has any sores or blisters. Asking if chemo causes this? Reviewed with NP, Lonna Cobb: Suggests she call her GYN, Dr. Miguel Aschoff to be seen-may need pelvic exam and culture done--doubts this is related to chemo. She will call GYN. Last office note and treatment record faxed to office.

## 2012-03-17 ENCOUNTER — Telehealth: Payer: Self-pay | Admitting: *Deleted

## 2012-03-17 NOTE — Telephone Encounter (Signed)
Due routine dental cleaning on 4/17 and asking if OK to keep this appointment? Informed her it is OK to have her teeth cleaned. Would need to plan ahead for any dental procedures that could cause bleeding or infection risk. Reminded her to have dentist rinse her mouth with warm water. She reports that GYN ordered external cream and po medication for vaginal yeast infection. She is having some discomfort in her mouth also and is using baking soda rinses and will purchase some Biotene.

## 2012-03-18 ENCOUNTER — Other Ambulatory Visit: Payer: Self-pay | Admitting: *Deleted

## 2012-03-18 DIAGNOSIS — C259 Malignant neoplasm of pancreas, unspecified: Secondary | ICD-10-CM

## 2012-03-18 MED ORDER — OXYCODONE HCL 20 MG PO TB12: 20.0000 mg | ORAL_TABLET | Freq: Two times a day (BID) | ORAL | Status: DC

## 2012-03-18 NOTE — Telephone Encounter (Signed)
Needs refill on Oxycontin 20 mg. Per [facility]-last fill was 02/18/12 #60.

## 2012-03-18 NOTE — Telephone Encounter (Signed)
Notified patient that script is ready for pickup.  

## 2012-03-22 ENCOUNTER — Other Ambulatory Visit: Payer: Self-pay | Admitting: Nurse Practitioner

## 2012-03-22 DIAGNOSIS — C259 Malignant neoplasm of pancreas, unspecified: Secondary | ICD-10-CM

## 2012-03-23 ENCOUNTER — Ambulatory Visit (HOSPITAL_BASED_OUTPATIENT_CLINIC_OR_DEPARTMENT_OTHER): Payer: BC Managed Care – PPO

## 2012-03-23 ENCOUNTER — Other Ambulatory Visit (HOSPITAL_BASED_OUTPATIENT_CLINIC_OR_DEPARTMENT_OTHER): Payer: BC Managed Care – PPO | Admitting: Lab

## 2012-03-23 ENCOUNTER — Ambulatory Visit (HOSPITAL_BASED_OUTPATIENT_CLINIC_OR_DEPARTMENT_OTHER): Payer: BC Managed Care – PPO | Admitting: Nurse Practitioner

## 2012-03-23 ENCOUNTER — Encounter: Payer: Self-pay | Admitting: Nurse Practitioner

## 2012-03-23 ENCOUNTER — Telehealth: Payer: Self-pay | Admitting: Oncology

## 2012-03-23 VITALS — BP 113/60 | HR 72 | Temp 97.6°F | Ht 61.0 in | Wt 161.8 lb

## 2012-03-23 DIAGNOSIS — K5900 Constipation, unspecified: Secondary | ICD-10-CM

## 2012-03-23 DIAGNOSIS — Z5111 Encounter for antineoplastic chemotherapy: Secondary | ICD-10-CM

## 2012-03-23 DIAGNOSIS — C251 Malignant neoplasm of body of pancreas: Secondary | ICD-10-CM

## 2012-03-23 DIAGNOSIS — R110 Nausea: Secondary | ICD-10-CM

## 2012-03-23 DIAGNOSIS — C7800 Secondary malignant neoplasm of unspecified lung: Secondary | ICD-10-CM

## 2012-03-23 DIAGNOSIS — C259 Malignant neoplasm of pancreas, unspecified: Secondary | ICD-10-CM

## 2012-03-23 DIAGNOSIS — C3490 Malignant neoplasm of unspecified part of unspecified bronchus or lung: Secondary | ICD-10-CM

## 2012-03-23 LAB — COMPREHENSIVE METABOLIC PANEL
BUN: 3 mg/dL — ABNORMAL LOW (ref 6–23)
CO2: 29 mEq/L (ref 19–32)
Calcium: 9.3 mg/dL (ref 8.4–10.5)
Chloride: 94 mEq/L — ABNORMAL LOW (ref 96–112)
Creatinine, Ser: 0.58 mg/dL (ref 0.50–1.10)
Total Bilirubin: 0.3 mg/dL (ref 0.3–1.2)

## 2012-03-23 LAB — CBC WITH DIFFERENTIAL/PLATELET
BASO%: 0.5 % (ref 0.0–2.0)
Eosinophils Absolute: 0.1 10*3/uL (ref 0.0–0.5)
HCT: 33.6 % — ABNORMAL LOW (ref 34.8–46.6)
LYMPH%: 26.3 % (ref 14.0–49.7)
MCHC: 33 g/dL (ref 31.5–36.0)
MCV: 87 fL (ref 79.5–101.0)
MONO#: 0.7 10*3/uL (ref 0.1–0.9)
MONO%: 16.6 % — ABNORMAL HIGH (ref 0.0–14.0)
NEUT%: 54.6 % (ref 38.4–76.8)
Platelets: 158 10*3/uL (ref 145–400)
RBC: 3.86 10*6/uL (ref 3.70–5.45)
WBC: 3.9 10*3/uL (ref 3.9–10.3)

## 2012-03-23 MED ORDER — FLUOROURACIL CHEMO INJECTION 5 GM/100ML
2400.0000 mg/m2 | INTRAVENOUS | Status: DC
  Administered 2012-03-23: 4400 mg via INTRAVENOUS
  Filled 2012-03-23: qty 88

## 2012-03-23 MED ORDER — PALONOSETRON HCL INJECTION 0.25 MG/5ML
0.2500 mg | Freq: Once | INTRAVENOUS | Status: AC
  Administered 2012-03-23: 0.25 mg via INTRAVENOUS

## 2012-03-23 MED ORDER — FLUOROURACIL CHEMO INJECTION 2.5 GM/50ML
400.0000 mg/m2 | Freq: Once | INTRAVENOUS | Status: AC
  Administered 2012-03-23: 750 mg via INTRAVENOUS
  Filled 2012-03-23: qty 15

## 2012-03-23 MED ORDER — OXALIPLATIN CHEMO INJECTION 100 MG/20ML
85.0000 mg/m2 | Freq: Once | INTRAVENOUS | Status: AC
  Administered 2012-03-23: 155 mg via INTRAVENOUS
  Filled 2012-03-23: qty 31

## 2012-03-23 MED ORDER — LEUCOVORIN CALCIUM INJECTION 350 MG
400.0000 mg/m2 | Freq: Once | INTRAVENOUS | Status: AC
  Administered 2012-03-23: 732 mg via INTRAVENOUS
  Filled 2012-03-23: qty 36.6

## 2012-03-23 MED ORDER — DEXAMETHASONE SODIUM PHOSPHATE 10 MG/ML IJ SOLN
10.0000 mg | Freq: Once | INTRAMUSCULAR | Status: AC
  Administered 2012-03-23: 10 mg via INTRAVENOUS

## 2012-03-23 MED ORDER — DEXTROSE 5 % IV SOLN
Freq: Once | INTRAVENOUS | Status: AC
  Administered 2012-03-23: 11:00:00 via INTRAVENOUS

## 2012-03-23 NOTE — Progress Notes (Signed)
OFFICE PROGRESS NOTE  Interval history:  Victoria Price returns as scheduled. She completed cycle 1 of FOLFOX beginning 03/10/2012. She developed nausea/vomiting on day 2. She noted mouth soreness. She did not develop ulcerations. She utilized Biotene mouth rinse. She had some loose stools. She took a single dose of Imodium. Bowel habits have returned to baseline. She did not experience cold sensitivity. She reports completing treatment for a vaginal yeast infection recently. She notes improved pain control with OxyContin and oxycodone.   Objective: Blood pressure 113/60, pulse 72, temperature 97.6 F (36.4 C), temperature source Oral, height 5\' 1"  (1.549 m), weight 161 lb 12.8 oz (73.392 kg).  Oropharynx is without thrush or ulceration. Lungs are clear. Regular cardiac rhythm. Port-A-Cath site is without erythema. Abdomen is soft with generalized tenderness. No organomegaly. Bowel sounds active. Extremities are without edema. Calves are soft and nontender  Lab Results: Lab Results  Component Value Date   WBC 3.9 03/23/2012   HGB 11.1* 03/23/2012   HCT 33.6* 03/23/2012   MCV 87.0 03/23/2012   PLT 158 03/23/2012    Chemistry:    Chemistry      Component Value Date/Time   NA 139 02/24/2012 0852   K 3.9 02/24/2012 0852   CL 101 02/24/2012 0852   CO2 28 02/24/2012 0852   BUN 4* 02/24/2012 0852   CREATININE 0.63 02/24/2012 0852      Component Value Date/Time   CALCIUM 9.7 02/24/2012 0852   ALKPHOS 129* 02/04/2012 1331   AST 30 02/04/2012 1331   ALT 44* 02/04/2012 1331   BILITOT 0.5 02/04/2012 1331       Studies/Results: Dg Chest 2 View  02/26/2012  *RADIOLOGY REPORT*  Clinical Data: Cough.  Lung mass.  Pancreatic mass.  Liver mass.  CHEST - 2 VIEW  Comparison: Chest x-ray dated 02/24/2012 and PET CT scan dated 02/12/2012  Findings: The large left upper lobe and perihilar mass is unchanged.  There is no peripheral volume loss in the left lung. Right lung is clear.  Heart size and vascularity are  normal.  No effusions.  No osseous abnormality.  IMPRESSION: No change in the appearance of the chest since 02/24/2012.  Large left perihilar mass.  Original Report Authenticated By: Gwynn Burly, M.D.   Dg Chest 2 View  02/24/2012  *RADIOLOGY REPORT*  Clinical Data: Preoperative study for bronchoscopy.  Prior history of smoking.  CHEST - 2 VIEW  Comparison: Head CT dated 02/12/2012.  Findings: In the left perihilar region there is a large mass that measures approximately 7.3 x 4.2 x 4.0 cm on this plain film examination, compatible with the known malignancy (better demonstrated on the prior PET CT dated 02/12/2012).  Lungs otherwise appear clear.  No pleural effusions.  Pulmonary vasculature is normal.  Heart size and mediastinal contours are otherwise unremarkable.  Atherosclerotic calcifications within the arch of the aorta.  IMPRESSION: 1.  No radiographic evidence of acute cardiopulmonary disease. 2.  Large left perihilar mass redemonstrated, highly suspicious for primary lung malignancy.  Original Report Authenticated By: Florencia Reasons, M.D.   Ct Head W Wo Contrast  03/04/2012  *RADIOLOGY REPORT*  Clinical Data:  Lung and pancreatic cancer, staging  CT HEAD WITHOUT AND WITH CONTRAST  Technique:  Contiguous axial images were obtained from the base of the skull through the vertex without and with intravenous contrast  Contrast:  Omnipaque-300, 100 ml.  Comparison:   None.  Findings:  The brain has a normal appearance without evidence for hemorrhage,  acute infarction, hydrocephalus or mass lesion.  There is no extra axial fluid collection.  The skull and paranasal sinuses are essentially normal except for minimal fluid in the left division of the sphenoid.  Following contrast infusion there is a normal enhancement pattern and no mass lesion is identified.  If there are no contraindications, MRI is more sensitive in the detection of intracranial metastatic disease.  IMPRESSION: Normal CT of the head  without and with contrast.  Original Report Authenticated By: Elsie Stain, M.D.   Ct Chest W Contrast  03/04/2012  *RADIOLOGY REPORT*  Clinical Data: A small cell lung cancer.  Staging scan.  CT CHEST WITH CONTRAST  Technique:  Multidetector CT imaging of the chest was performed following the standard protocol during bolus administration of intravenous contrast.  Contrast:  100 ml of Omnipaque-300  Comparison: Multiple priors, including prior PET CT dated 02/12/2012.  Findings:  Mediastinum: Mildly enlarged subcarinal lymph node (14 mm in short axis, image 26 of series 4), and enlarged left paratracheal lymph node (14 mm, image 19 of series 4). Heart size is normal. There is no significant pericardial fluid, thickening or pericardial calcification. There is a small hiatal hernia.  Lungs/Pleura: Again noted is a large mass which is centered in the left upper lung involving portions of both the left upper and left lower lobe, measuring at least measuring at least 5.8 x 5.1 cm (accurate measurement is difficult given the irregular shape of this lesion).  Within the adjacent portions of the superior segment of the left lower lobe there are postobstructive changes likely reflect pneumonitis.  There is a small satellite nodule in the superior segment of the left lower lobe (image 32 of series 9) which measures 1.3 cm.  No contralateral lung nodules are identified.  No pleural effusions.  Upper Abdomen: Within the body of the pancreas there is a very large hypervascular mass which currently measures 7.3 x 5.2 cm. 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 There are multiple splenoportal venous collaterals.  The SMA is patent at this time.  The mass partially encases the splenic artery branches, however, these appear patent at this time.  Visualized portions of the liver are unremarkable.  Musculoskeletal: There are no aggressive appearing lytic or blastic lesions noted in the visualized portions of  the skeleton.  IMPRESSION:  1.  Large left lung mass which appears to involve portions of the superior segment of the left lower lobe, as well as the posterior aspect of the left upper lobe, currently measures 5.8 x 5.1 cm and is associated with subcarinal and left paratracheal adenopathy, as well as a small satellite nodule in the superior segment left lower lobe, above. 2.  Previously noted mass in the pancreas appears slightly enlarged compared the recent PET CT and currently measures 5.2 x 7.3 cm. 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  Original Report Authenticated By: Florencia Reasons, M.D.   Ir Fluoro Guide Cv Line Right  03/08/2012  *RADIOLOGY REPORT*  Indication: Pancreatic and possible lung cancer, in need of intravenous access for chemotherapy administration.  IMPLANTED PORT A CATH PLACEMENT WITH ULTRASOUND AND FLUOROSCOPIC GUIDANCE  Sedation: Versed 4 mg IV; Fentanyl 300 mcg IV; Dilaudid 2 mg IV; Ancef 1 gm IV; IV antibiotic was given in an appropriate time interval prior to skin puncture.  Total Moderate Sedation Time: 50 minutes.  Contrast: None  Fluoroscopy Time: 0.6 minutes.  Complications: None  immediate  Procedure:  The procedure, risks, benefits, and alternatives were explained to the patient.  Questions regarding the procedure were encouraged and answered.  The patient understands and consents to the procedure.  The right neck and chest were prepped with chlorhexidine in a sterile fashion, and a sterile drape was applied covering the operative field.  Maximum barrier sterile technique with sterile gowns and gloves were used for the procedure.  A timeout was performed prior to the initiation of the procedure.  Local anesthesia was provided with 1% lidocaine with epinephrine.  After creating a small venotomy incision, a micropuncture kit was utilized to access the right internal jugular vein under direct, real-time ultrasound guidance.   Ultrasound image documentation was performed.  The microwire was kinked to measure appropriate catheter length.  A subcutaneous port pocket was then created along the upper chest wall utilizing a combination of sharp and blunt dissection.  The pocket was irrigated with sterile saline.  A single lumen ISP power injectable port was chosen for placement.  The 8 Fr catheter was tunneled from the port pocket site to the venotomy incision.  The port was placed in the pocket.  The external catheter was trimmed to appropriate length.  At the venotomy, an 8 Fr peel-away sheath was placed over a guidewire under fluoroscopic guidance.  The catheter was then placed through the sheath and the sheath was removed.  Final catheter positioning was confirmed and documented with a fluoroscopic spot radiograph.  The port was accessed with a needle and aspirated and flushed with heparinized saline.  The venotomy and port pocket incisions were closed with subcutaneous 2-0 Vicryl and a running subcuticular 4-0 Vicryl. Dermabond and Steri-strips were applied to both incisions. Dressings were placed.  The patient tolerated the procedure well without immediate post procedural complication.  Findings:  After catheter placement, the tip lies at the superior cavoatrial junction.  The catheter aspirates normally and is ready for immediate use.  IMPRESSION:  Successful placement of a right internal jugular approach single lumen power injectable Port-A-Cath.  The catheter is ready for immediate use.  Original Report Authenticated By: Waynard Reeds, M.D.   Ir US Guide Vasc Access Right  03/08/2012  *RADIOLOGY REPORT*  Indication: Pancreatic and possible lung cancer, in need of intravenous access for chemotherapy administration.  IMPLANTED PORT A CATH PLACEMENT WITH ULTRASOUND AND FLUOROSCOPIC GUIDANCE  Sedation: Versed 4 mg IV; Fentanyl 300 mcg IV; Dilaudid 2 mg IV; Ancef 1 gm IV; IV antibiotic was given in an appropriate time interval prior  to skin puncture.  Total Moderate Sedation Time: 50 minutes.  Contrast: None  Fluoroscopy Time: 0.6 minutes.  Complications: None immediate  Procedure:  The procedure, risks, benefits, and alternatives were explained to the patient.  Questions regarding the procedure were encouraged and answered.  The patient understands and consents to the procedure.  The right neck and chest were prepped with chlorhexidine in a sterile fashion, and a sterile drape was applied covering the operative field.  Maximum barrier sterile technique with sterile gowns and gloves were used for the procedure.  A timeout was performed prior to the initiation of the procedure.  Local anesthesia was provided with 1% lidocaine with epinephrine.  After creating a small venotomy incision, a micropuncture kit was utilized to access the right internal jugular vein under direct, real-time ultrasound guidance.  Ultrasound image documentation was performed.  The microwire was kinked to measure appropriate catheter length.  A subcutaneous port pocket was then created  along the upper chest wall utilizing a combination of sharp and blunt dissection.  The pocket was irrigated with sterile saline.  A single lumen ISP power injectable port was chosen for placement.  The 8 Fr catheter was tunneled from the port pocket site to the venotomy incision.  The port was placed in the pocket.  The external catheter was trimmed to appropriate length.  At the venotomy, an 8 Fr peel-away sheath was placed over a guidewire under fluoroscopic guidance.  The catheter was then placed through the sheath and the sheath was removed.  Final catheter positioning was confirmed and documented with a fluoroscopic spot radiograph.  The port was accessed with a needle and aspirated and flushed with heparinized saline.  The venotomy and port pocket incisions were closed with subcutaneous 2-0 Vicryl and a running subcuticular 4-0 Vicryl. Dermabond and Steri-strips were applied to both  incisions. Dressings were placed.  The patient tolerated the procedure well without immediate post procedural complication.  Findings:  After catheter placement, the tip lies at the superior cavoatrial junction.  The catheter aspirates normally and is ready for immediate use.  IMPRESSION:  Successful placement of a right internal jugular approach single lumen power injectable Port-A-Cath.  The catheter is ready for immediate use.  Original Report Authenticated By: Waynard Reeds, M.D.    Medications: I have reviewed the patient's current medications.  Assessment/Plan:  1. Locally advanced pancreas cancer, adenosquamous carcinoma, presenting with a pancreas body mass and CT/ultrasound evidence of portal vein/SMV invasion. She completed cycle 1 of FOLFOX 03/10/2012. 2. Delayed nausea following cycle 1 of FOLFOX. We will adjust the premedication regimen to include Aloxi. She will begin dexamethasone 8 mg twice daily for 2 days beginning 03/24/2012. 3. Pain secondary to the locally advanced pancreas mass. Improved with OxyContin. 4. Small cell lung cancer. 5. Staging PET scan 02/12/2012 confirming a hypermetabolic left lung mass, hypermetabolic pancreas mass, hypermetabolic peritoneal implant. Status post bronchoscopy confirming a left endobronchial mass and left hilar mass on 02/24/2012. Biopsy of both areas confirmed small cell carcinoma. 6. Baseline nausea. Question related to tumor abutting the stomach. 7. Constipation. She continues daily MiraLAX.  Disposition-Ms. Brannigan appears stable. Plan to proceed with cycle 2 FOLFOX today as planned. She will return for a followup visit and cycle 3 FOLFOX in 2 weeks. She will contact the office in the interim with any problems. We will obtain a repeat chest x-ray when she returns in 2 weeks.  Plan reviewed with Dr. Truett Perna.  Lonna Cobb ANP/GNP-BC

## 2012-03-23 NOTE — Telephone Encounter (Signed)
appts made and printed for pt aom °

## 2012-03-25 ENCOUNTER — Other Ambulatory Visit: Payer: Self-pay | Admitting: Oncology

## 2012-03-25 ENCOUNTER — Ambulatory Visit (HOSPITAL_BASED_OUTPATIENT_CLINIC_OR_DEPARTMENT_OTHER): Payer: BC Managed Care – PPO

## 2012-03-25 VITALS — BP 140/82 | HR 72 | Temp 97.0°F

## 2012-03-25 DIAGNOSIS — C7800 Secondary malignant neoplasm of unspecified lung: Secondary | ICD-10-CM

## 2012-03-25 DIAGNOSIS — C251 Malignant neoplasm of body of pancreas: Secondary | ICD-10-CM

## 2012-03-25 DIAGNOSIS — C259 Malignant neoplasm of pancreas, unspecified: Secondary | ICD-10-CM

## 2012-03-25 MED ORDER — SODIUM CHLORIDE 0.9 % IJ SOLN
10.0000 mL | INTRAMUSCULAR | Status: DC | PRN
  Administered 2012-03-25: 10 mL
  Filled 2012-03-25: qty 10

## 2012-03-25 MED ORDER — HEPARIN SOD (PORK) LOCK FLUSH 100 UNIT/ML IV SOLN
500.0000 [IU] | Freq: Once | INTRAVENOUS | Status: AC | PRN
  Administered 2012-03-25: 500 [IU]
  Filled 2012-03-25: qty 5

## 2012-03-25 NOTE — Patient Instructions (Signed)
Call MD for problems 

## 2012-03-25 NOTE — Progress Notes (Signed)
Patient with nauses this treatment.  Reminded patient to take her antiemetic evening of treatment and prn as needed

## 2012-03-28 ENCOUNTER — Other Ambulatory Visit: Payer: Self-pay | Admitting: *Deleted

## 2012-03-28 DIAGNOSIS — C259 Malignant neoplasm of pancreas, unspecified: Secondary | ICD-10-CM

## 2012-03-28 MED ORDER — OXYCODONE HCL 5 MG PO TABS: ORAL_TABLET | ORAL | Status: DC

## 2012-04-01 ENCOUNTER — Other Ambulatory Visit: Payer: Self-pay | Admitting: *Deleted

## 2012-04-01 DIAGNOSIS — C259 Malignant neoplasm of pancreas, unspecified: Secondary | ICD-10-CM

## 2012-04-01 MED ORDER — LORAZEPAM 1 MG PO TABS: 1.0000 mg | ORAL_TABLET | Freq: Four times a day (QID) | ORAL | Status: DC | PRN

## 2012-04-01 NOTE — Telephone Encounter (Signed)
Requests refill, "I only have 4 left".

## 2012-04-04 ENCOUNTER — Other Ambulatory Visit: Payer: Self-pay | Admitting: *Deleted

## 2012-04-04 DIAGNOSIS — C259 Malignant neoplasm of pancreas, unspecified: Secondary | ICD-10-CM

## 2012-04-04 MED ORDER — DEXAMETHASONE 4 MG PO TABS: 8.0000 mg | ORAL_TABLET | Freq: Two times a day (BID) | ORAL | Status: DC

## 2012-04-04 NOTE — Telephone Encounter (Signed)
Requests refills in preparation for next chemo treatment. Requests nurse call her when this has been done. Made her aware to give nurse 24 hours to carry out refill and then check with pharmacy. We are not able to call patients back to confirm refill has been processed.

## 2012-04-05 ENCOUNTER — Other Ambulatory Visit: Payer: Self-pay | Admitting: Oncology

## 2012-04-05 ENCOUNTER — Ambulatory Visit (HOSPITAL_COMMUNITY)
Admission: RE | Admit: 2012-04-05 | Discharge: 2012-04-05 | Disposition: A | Discharge status: Home / Self Care | Payer: BC Managed Care – PPO | Source: Ambulatory Visit | Attending: Nurse Practitioner | Admitting: Nurse Practitioner

## 2012-04-05 DIAGNOSIS — R222 Localized swelling, mass and lump, trunk: Secondary | ICD-10-CM | POA: Insufficient documentation

## 2012-04-05 DIAGNOSIS — C3490 Malignant neoplasm of unspecified part of unspecified bronchus or lung: Secondary | ICD-10-CM

## 2012-04-06 ENCOUNTER — Telehealth: Payer: Self-pay | Admitting: Oncology

## 2012-04-06 ENCOUNTER — Ambulatory Visit: Payer: BC Managed Care – PPO | Admitting: Nutrition

## 2012-04-06 ENCOUNTER — Ambulatory Visit (HOSPITAL_BASED_OUTPATIENT_CLINIC_OR_DEPARTMENT_OTHER): Payer: BC Managed Care – PPO

## 2012-04-06 ENCOUNTER — Ambulatory Visit (HOSPITAL_COMMUNITY): Payer: BC Managed Care – PPO

## 2012-04-06 ENCOUNTER — Other Ambulatory Visit: Payer: BC Managed Care – PPO | Admitting: Lab

## 2012-04-06 ENCOUNTER — Ambulatory Visit: Payer: BC Managed Care – PPO | Admitting: Nurse Practitioner

## 2012-04-06 VITALS — BP 99/65 | HR 68 | Temp 97.1°F | Ht 61.0 in | Wt 159.3 lb

## 2012-04-06 DIAGNOSIS — C3490 Malignant neoplasm of unspecified part of unspecified bronchus or lung: Secondary | ICD-10-CM

## 2012-04-06 DIAGNOSIS — C259 Malignant neoplasm of pancreas, unspecified: Secondary | ICD-10-CM

## 2012-04-06 DIAGNOSIS — Z5111 Encounter for antineoplastic chemotherapy: Secondary | ICD-10-CM

## 2012-04-06 LAB — CBC WITH DIFFERENTIAL/PLATELET
Basophils Absolute: 0 10*3/uL (ref 0.0–0.1)
EOS%: 0.9 % (ref 0.0–7.0)
Eosinophils Absolute: 0 10*3/uL (ref 0.0–0.5)
HGB: 11.5 g/dL — ABNORMAL LOW (ref 11.6–15.9)
LYMPH%: 31.6 % (ref 14.0–49.7)
MCH: 28.8 pg (ref 25.1–34.0)
MCV: 87.7 fL (ref 79.5–101.0)
MONO%: 20.4 % — ABNORMAL HIGH (ref 0.0–14.0)
NEUT#: 1.6 10*3/uL (ref 1.5–6.5)
Platelets: 115 10*3/uL — ABNORMAL LOW (ref 145–400)
RDW: 15.5 % — ABNORMAL HIGH (ref 11.2–14.5)

## 2012-04-06 LAB — COMPREHENSIVE METABOLIC PANEL
Alkaline Phosphatase: 195 U/L — ABNORMAL HIGH (ref 39–117)
BUN: 5 mg/dL — ABNORMAL LOW (ref 6–23)
CO2: 28 mEq/L (ref 19–32)
Creatinine, Ser: 0.71 mg/dL (ref 0.50–1.10)
Glucose, Bld: 118 mg/dL — ABNORMAL HIGH (ref 70–99)
Sodium: 136 mEq/L (ref 135–145)
Total Bilirubin: 0.3 mg/dL (ref 0.3–1.2)

## 2012-04-06 MED ORDER — SODIUM CHLORIDE 0.9 % IV SOLN
2400.0000 mg/m2 | INTRAVENOUS | Status: DC
  Administered 2012-04-06: 4400 mg via INTRAVENOUS
  Filled 2012-04-06: qty 88

## 2012-04-06 MED ORDER — DEXAMETHASONE SODIUM PHOSPHATE 10 MG/ML IJ SOLN
10.0000 mg | Freq: Once | INTRAMUSCULAR | Status: AC
  Administered 2012-04-06: 10 mg via INTRAVENOUS

## 2012-04-06 MED ORDER — DEXTROSE 5 % IV SOLN
400.0000 mg/m2 | Freq: Once | INTRAVENOUS | Status: AC
  Administered 2012-04-06: 732 mg via INTRAVENOUS
  Filled 2012-04-06: qty 36.6

## 2012-04-06 MED ORDER — SODIUM CHLORIDE 0.9 % IJ SOLN
10.0000 mL | INTRAMUSCULAR | Status: DC | PRN
  Filled 2012-04-06: qty 10

## 2012-04-06 MED ORDER — DEXTROSE 5 % IV SOLN
Freq: Once | INTRAVENOUS | Status: AC
  Administered 2012-04-06: 10:00:00 via INTRAVENOUS

## 2012-04-06 MED ORDER — OXALIPLATIN CHEMO INJECTION 100 MG/20ML
85.0000 mg/m2 | Freq: Once | INTRAVENOUS | Status: AC
  Administered 2012-04-06: 155 mg via INTRAVENOUS
  Filled 2012-04-06: qty 31

## 2012-04-06 MED ORDER — HEPARIN SOD (PORK) LOCK FLUSH 100 UNIT/ML IV SOLN
500.0000 [IU] | Freq: Once | INTRAVENOUS | Status: DC | PRN
  Filled 2012-04-06: qty 5

## 2012-04-06 MED ORDER — PALONOSETRON HCL INJECTION 0.25 MG/5ML
0.2500 mg | Freq: Once | INTRAVENOUS | Status: AC
  Administered 2012-04-06: 0.25 mg via INTRAVENOUS

## 2012-04-06 MED ORDER — FLUOROURACIL CHEMO INJECTION 2.5 GM/50ML
400.0000 mg/m2 | Freq: Once | INTRAVENOUS | Status: AC
  Administered 2012-04-06: 750 mg via INTRAVENOUS
  Filled 2012-04-06: qty 15

## 2012-04-06 NOTE — Progress Notes (Signed)
I spoke with Victoria Price and her husband today in the chemotherapy area. Her weight continues to decline and was documented as 159.3 pounds today, which is decreased from 161.8 pounds on April 10.  She reports she has been drinking Boost Breeze and some over-the-counter nutritional supplements that she can buy at the grocery store and has tolerated these fairly well.  She continues to work to find foods that do not cause intolerance or an allergic reaction.  This changes from time to time.  She does complain of taste alterations.  NUTRITION DIAGNOSIS:  Unintended weight loss continues.  INTERVENTION:  I have educated the patient on strategies for dealing with taste alterations and provided a handout for her to take with her today.  I have reinforced the importance of eating small amounts every 2-3 hours throughout the day including nutritional oral supplement.  I have asked her to increase this to twice a day.  I have answered her questions.  MONITORING/EVALUATION/GOALS:  The patient has been unable to increase her oral intake to minimize weight loss.  She will work to add in a 2nd nutritional supplement daily.  NEXT VISIT:  Wednesday, May 8th, during chemotherapy.    ______________________________ Zenovia Jarred, RD, LDN Clinical Nutrition Specialist BN/MEDQ  D:  04/06/2012  T:  04/06/2012  Job:  [id number]

## 2012-04-06 NOTE — Progress Notes (Signed)
OFFICE PROGRESS NOTE  Interval history:  Ms. Christians returns as scheduled. She completed cycle 2 FOLFOX on 03/23/2012. She had nausea for 3 days following the chemotherapy. The nausea was relieved with oral anti-emetics. She again noted mouth soreness. She did not develop ulcerations. She denies diarrhea. She denies neuropathy symptoms. Overall her pain is well controlled. She continues OxyContin with infrequent use of oxycodone.   Objective: Blood pressure 99/65, pulse 68, temperature 97.1 F (36.2 C), temperature source Oral, height 5\' 1"  (1.549 m), weight 159 lb 4.8 oz (72.258 kg).  Oropharynx is without thrush or ulceration. Lungs are clear. Regular cardiac rhythm. Port-A-Cath site is without erythema. Abdomen is soft. She is tender at the left lateral abdomen. No organomegaly. Extremities are without edema. Calves are soft and nontender.  Lab Results: Lab Results  Component Value Date   WBC 3.4* 04/06/2012   HGB 11.5* 04/06/2012   HCT 35.0 04/06/2012   MCV 87.7 04/06/2012   PLT 115* 04/06/2012    Chemistry:    Chemistry      Component Value Date/Time   NA 132* 03/23/2012 0840   K 3.8 03/23/2012 0840   CL 94* 03/23/2012 0840   CO2 29 03/23/2012 0840   BUN 3* 03/23/2012 0840   CREATININE 0.58 03/23/2012 0840      Component Value Date/Time   CALCIUM 9.3 03/23/2012 0840   ALKPHOS 210* 03/23/2012 0840   AST 31 03/23/2012 0840   ALT 40* 03/23/2012 0840   BILITOT 0.3 03/23/2012 0840       Studies/Results: Dg Chest 2 View  04/05/2012  *RADIOLOGY REPORT*  Clinical Data: Follow up lung mass  CHEST - 2 VIEW  Comparison: CT chest dated 03/04/2012.  Findings: Large left perihilar/lung mass, measuring approximately 6.3 cm, grossly unchanged from prior CT.  No pleural effusion or pneumothorax.  Cardiomediastinal silhouette is within normal limits.  Right chest power port.  IMPRESSION: 6.3 cm left perihilar/lung mass, grossly unchanged.  Original Report Authenticated By: Charline Bills, M.D.    Ir Fluoro Guide Cv Line Right  03/08/2012  *RADIOLOGY REPORT*  Indication: Pancreatic and possible lung cancer, in need of intravenous access for chemotherapy administration.  IMPLANTED PORT A CATH PLACEMENT WITH ULTRASOUND AND FLUOROSCOPIC GUIDANCE  Sedation: Versed 4 mg IV; Fentanyl 300 mcg IV; Dilaudid 2 mg IV; Ancef 1 gm IV; IV antibiotic was given in an appropriate time interval prior to skin puncture.  Total Moderate Sedation Time: 50 minutes.  Contrast: None  Fluoroscopy Time: 0.6 minutes.  Complications: None immediate  Procedure:  The procedure, risks, benefits, and alternatives were explained to the patient.  Questions regarding the procedure were encouraged and answered.  The patient understands and consents to the procedure.  The right neck and chest were prepped with chlorhexidine in a sterile fashion, and a sterile drape was applied covering the operative field.  Maximum barrier sterile technique with sterile gowns and gloves were used for the procedure.  A timeout was performed prior to the initiation of the procedure.  Local anesthesia was provided with 1% lidocaine with epinephrine.  After creating a small venotomy incision, a micropuncture kit was utilized to access the right internal jugular vein under direct, real-time ultrasound guidance.  Ultrasound image documentation was performed.  The microwire was kinked to measure appropriate catheter length.  A subcutaneous port pocket was then created along the upper chest wall utilizing a combination of sharp and blunt dissection.  The pocket was irrigated with sterile saline.  A single lumen ISP power  injectable port was chosen for placement.  The 8 Fr catheter was tunneled from the port pocket site to the venotomy incision.  The port was placed in the pocket.  The external catheter was trimmed to appropriate length.  At the venotomy, an 8 Fr peel-away sheath was placed over a guidewire under fluoroscopic guidance.  The catheter was then placed  through the sheath and the sheath was removed.  Final catheter positioning was confirmed and documented with a fluoroscopic spot radiograph.  The port was accessed with a needle and aspirated and flushed with heparinized saline.  The venotomy and port pocket incisions were closed with subcutaneous 2-0 Vicryl and a running subcuticular 4-0 Vicryl. Dermabond and Steri-strips were applied to both incisions. Dressings were placed.  The patient tolerated the procedure well without immediate post procedural complication.  Findings:  After catheter placement, the tip lies at the superior cavoatrial junction.  The catheter aspirates normally and is ready for immediate use.  IMPRESSION:  Successful placement of a right internal jugular approach single lumen power injectable Port-A-Cath.  The catheter is ready for immediate use.  Original Report Authenticated By: Waynard Reeds, M.D.   Ir US Guide Vasc Access Right  03/08/2012  *RADIOLOGY REPORT*  Indication: Pancreatic and possible lung cancer, in need of intravenous access for chemotherapy administration.  IMPLANTED PORT A CATH PLACEMENT WITH ULTRASOUND AND FLUOROSCOPIC GUIDANCE  Sedation: Versed 4 mg IV; Fentanyl 300 mcg IV; Dilaudid 2 mg IV; Ancef 1 gm IV; IV antibiotic was given in an appropriate time interval prior to skin puncture.  Total Moderate Sedation Time: 50 minutes.  Contrast: None  Fluoroscopy Time: 0.6 minutes.  Complications: None immediate  Procedure:  The procedure, risks, benefits, and alternatives were explained to the patient.  Questions regarding the procedure were encouraged and answered.  The patient understands and consents to the procedure.  The right neck and chest were prepped with chlorhexidine in a sterile fashion, and a sterile drape was applied covering the operative field.  Maximum barrier sterile technique with sterile gowns and gloves were used for the procedure.  A timeout was performed prior to the initiation of the procedure.  Local  anesthesia was provided with 1% lidocaine with epinephrine.  After creating a small venotomy incision, a micropuncture kit was utilized to access the right internal jugular vein under direct, real-time ultrasound guidance.  Ultrasound image documentation was performed.  The microwire was kinked to measure appropriate catheter length.  A subcutaneous port pocket was then created along the upper chest wall utilizing a combination of sharp and blunt dissection.  The pocket was irrigated with sterile saline.  A single lumen ISP power injectable port was chosen for placement.  The 8 Fr catheter was tunneled from the port pocket site to the venotomy incision.  The port was placed in the pocket.  The external catheter was trimmed to appropriate length.  At the venotomy, an 8 Fr peel-away sheath was placed over a guidewire under fluoroscopic guidance.  The catheter was then placed through the sheath and the sheath was removed.  Final catheter positioning was confirmed and documented with a fluoroscopic spot radiograph.  The port was accessed with a needle and aspirated and flushed with heparinized saline.  The venotomy and port pocket incisions were closed with subcutaneous 2-0 Vicryl and a running subcuticular 4-0 Vicryl. Dermabond and Steri-strips were applied to both incisions. Dressings were placed.  The patient tolerated the procedure well without immediate post procedural complication.  Findings:  After catheter placement, the tip lies at the superior cavoatrial junction.  The catheter aspirates normally and is ready for immediate use.  IMPRESSION:  Successful placement of a right internal jugular approach single lumen power injectable Port-A-Cath.  The catheter is ready for immediate use.  Original Report Authenticated By: Waynard Reeds, M.D.    Medications: I have reviewed the patient's current medications.  Assessment/Plan:  1. Locally advanced pancreas cancer, adenosquamous carcinoma, presenting with a  pancreas body mass and CT/ultrasound evidence of portal vein/SMV invasion. She completed cycle 1 of FOLFOX 03/10/2012. She completed cycle 2 FOLFOX 03/23/2012. 2. Delayed nausea following cycle 1 of FOLFOX. She receives Aloxi premedication. She takes prophylactic dexamethasone 8 mg twice daily for 2 days beginning day 2. 3. Pain secondary to the locally advanced pancreas mass. Improved with OxyContin. 4. Small cell lung cancer. Left perihilar/lung mass is stable on chest x-ray 04/05/2012. 5. Staging PET scan 02/12/2012 confirming a hypermetabolic left lung mass, hypermetabolic pancreas mass, hypermetabolic peritoneal implant. Status post bronchoscopy confirming a left endobronchial mass and left hilar mass on 02/24/2012. Biopsy of both areas confirmed small cell carcinoma. 6. Baseline nausea. Question related to tumor abutting the stomach. 7. Constipation. She continues daily MiraLAX.  Disposition-Ms. Pohlman appears stable. Plan to proceed with cycle 3 FOLFOX today as scheduled. She will return for a followup visit and cycle 4 FOLFOX in 2 weeks. She will contact the office in the interim with any problems.  Plan reviewed with Dr. Truett Perna.  Lonna Cobb ANP/GNP-BC

## 2012-04-06 NOTE — Telephone Encounter (Signed)
gv pts husband scheduled for [id number]

## 2012-04-08 ENCOUNTER — Ambulatory Visit (HOSPITAL_BASED_OUTPATIENT_CLINIC_OR_DEPARTMENT_OTHER): Payer: BC Managed Care – PPO

## 2012-04-08 VITALS — BP 124/78 | HR 63 | Temp 96.8°F

## 2012-04-08 DIAGNOSIS — C7800 Secondary malignant neoplasm of unspecified lung: Secondary | ICD-10-CM

## 2012-04-08 DIAGNOSIS — C251 Malignant neoplasm of body of pancreas: Secondary | ICD-10-CM

## 2012-04-08 DIAGNOSIS — C259 Malignant neoplasm of pancreas, unspecified: Secondary | ICD-10-CM

## 2012-04-08 MED ORDER — SODIUM CHLORIDE 0.9 % IJ SOLN
10.0000 mL | INTRAMUSCULAR | Status: DC | PRN
  Administered 2012-04-08: 10 mL via INTRAVENOUS
  Filled 2012-04-08: qty 10

## 2012-04-08 MED ORDER — HEPARIN SOD (PORK) LOCK FLUSH 100 UNIT/ML IV SOLN
500.0000 [IU] | Freq: Once | INTRAVENOUS | Status: AC
  Administered 2012-04-08: 500 [IU] via INTRAVENOUS
  Filled 2012-04-08: qty 5

## 2012-04-08 MED ORDER — HEPARIN SOD (PORK) LOCK FLUSH 100 UNIT/ML IV SOLN: 500.0000 [IU] | Freq: Once | INTRAVENOUS | Status: AC | PRN

## 2012-04-08 MED ORDER — SODIUM CHLORIDE 0.9 % IJ SOLN: 10.0000 mL | INTRAMUSCULAR | Status: DC | PRN

## 2012-04-08 NOTE — Progress Notes (Signed)
Pump d/c'd 

## 2012-04-08 NOTE — Progress Notes (Signed)
Addended by: Luiz Ochoa on: 04/08/2012 04:04 PM   Modules accepted: Orders

## 2012-04-14 ENCOUNTER — Other Ambulatory Visit: Payer: Self-pay | Admitting: *Deleted

## 2012-04-14 DIAGNOSIS — C259 Malignant neoplasm of pancreas, unspecified: Secondary | ICD-10-CM

## 2012-04-14 MED ORDER — LORAZEPAM 1 MG PO TABS: 1.0000 mg | ORAL_TABLET | Freq: Four times a day (QID) | ORAL | Status: DC | PRN

## 2012-04-14 MED ORDER — OXYCODONE HCL 20 MG PO TB12: 20.0000 mg | ORAL_TABLET | Freq: Two times a day (BID) | ORAL | Status: DC

## 2012-04-14 NOTE — Telephone Encounter (Signed)
Left VM that oxycontin script is ready for pick up and Ativan has been called in.

## 2012-04-19 ENCOUNTER — Other Ambulatory Visit: Payer: Self-pay | Admitting: Oncology

## 2012-04-20 ENCOUNTER — Other Ambulatory Visit (HOSPITAL_BASED_OUTPATIENT_CLINIC_OR_DEPARTMENT_OTHER): Payer: BC Managed Care – PPO | Admitting: Lab

## 2012-04-20 ENCOUNTER — Inpatient Hospital Stay: Payer: BC Managed Care – PPO

## 2012-04-20 ENCOUNTER — Ambulatory Visit (HOSPITAL_BASED_OUTPATIENT_CLINIC_OR_DEPARTMENT_OTHER): Payer: BC Managed Care – PPO | Admitting: Oncology

## 2012-04-20 ENCOUNTER — Encounter: Payer: BC Managed Care – PPO | Admitting: Nutrition

## 2012-04-20 VITALS — BP 93/51 | HR 71 | Temp 97.3°F | Ht 61.0 in | Wt 157.4 lb

## 2012-04-20 DIAGNOSIS — C259 Malignant neoplasm of pancreas, unspecified: Secondary | ICD-10-CM

## 2012-04-20 DIAGNOSIS — D702 Other drug-induced agranulocytosis: Secondary | ICD-10-CM

## 2012-04-20 DIAGNOSIS — C251 Malignant neoplasm of body of pancreas: Secondary | ICD-10-CM

## 2012-04-20 DIAGNOSIS — D6959 Other secondary thrombocytopenia: Secondary | ICD-10-CM

## 2012-04-20 DIAGNOSIS — C3490 Malignant neoplasm of unspecified part of unspecified bronchus or lung: Secondary | ICD-10-CM

## 2012-04-20 LAB — CBC WITH DIFFERENTIAL/PLATELET
BASO%: 0.7 % (ref 0.0–2.0)
Basophils Absolute: 0 10e3/uL (ref 0.0–0.1)
EOS%: 2.9 % (ref 0.0–7.0)
Eosinophils Absolute: 0.1 10e3/uL (ref 0.0–0.5)
HCT: 34.6 % — ABNORMAL LOW (ref 34.8–46.6)
HGB: 11.4 g/dL — ABNORMAL LOW (ref 11.6–15.9)
LYMPH%: 50.7 % — ABNORMAL HIGH (ref 14.0–49.7)
MCH: 28.7 pg (ref 25.1–34.0)
MCHC: 32.9 g/dL (ref 31.5–36.0)
MCV: 87.2 fL (ref 79.5–101.0)
MONO#: 0.6 10e3/uL (ref 0.1–0.9)
MONO%: 21.6 % — ABNORMAL HIGH (ref 0.0–14.0)
NEUT#: 0.7 10e3/uL — ABNORMAL LOW (ref 1.5–6.5)
NEUT%: 24.1 % — ABNORMAL LOW (ref 38.4–76.8)
Platelets: 79 10e3/uL — ABNORMAL LOW (ref 145–400)
RBC: 3.97 10e6/uL (ref 3.70–5.45)
RDW: 17.4 % — ABNORMAL HIGH (ref 11.2–14.5)
WBC: 2.8 10e3/uL — ABNORMAL LOW (ref 3.9–10.3)
lymph#: 1.4 10e3/uL (ref 0.9–3.3)
nRBC: 0 % (ref 0–0)

## 2012-04-20 NOTE — Progress Notes (Signed)
OFFICE PROGRESS NOTE   INTERVAL HISTORY:   She completed a third cycle of FOLFOX on 04/06/2012. She tolerated chemotherapy well. She has avoided cold food and drink. No neuropathy symptoms today. No nausea, mouth sores, or diarrhea. The left lateral abdomen/low chest pain is markedly improved. She has decreased the OxyContin to 10 mg twice daily. Her appetite has improved.  Objective:  Vital signs in last 24 hours:  Blood pressure 93/51, pulse 71, temperature 97.3 F (36.3 C), temperature source Oral, height 5\' 1"  (1.549 m), weight 157 lb 6.4 oz (71.396 kg).    HEENT: No thrush or ulcers Resp: End inspiratory rhonchi at the low right greater than left anterolateral chest. The posterior chest is clear. Cardio: Regular rate and rhythm GI: No hepatomegaly, mild tenderness in the left upper abdomen, no mass Vascular: No leg edema   Portacath/PICC-without erythema  Lab Results:  Lab Results  Component Value Date   WBC 2.8* 04/20/2012   HGB 11.4* 04/20/2012   HCT 34.6* 04/20/2012   MCV 87.2 04/20/2012   PLT 79* 04/20/2012   ANC 0.7    Medications: I have reviewed the patient's current medications.  1. Locally advanced pancreas cancer, adenosquamous carcinoma, presenting with a pancreas body mass and CT/ultrasound evidence of portal vein/SMV invasion. She completed cycle 1 of FOLFOX 03/10/2012.  2. Delayed nausea following cycle 1 of FOLFOX. She receives Aloxi premedication. She takes prophylactic dexamethasone 8 mg twice daily for 2 days beginning day 2. No nausea following cycle 3 FOLFOX 3. Pain secondary to the locally advanced pancreas mass. Improved 4. Small cell lung cancer. Left perihilar/lung mass was stable on chest x-ray 04/05/2012. 5. Staging PET scan 02/12/2012 confirming a hypermetabolic left lung mass, hypermetabolic pancreas mass, hypermetabolic peritoneal implant. Status post bronchoscopy confirming a left endobronchial mass and left hilar mass on 02/24/2012. Biopsy of both  areas confirmed small cell carcinoma. 6. Baseline nausea. Question related to tumor abutting the stomach. Improved 7. Constipation. She continues daily MiraLAX 8. Neutropenia/thrombocytopenia secondary to chemotherapy  Disposition:  Her performance status appears much improved. The pain, nausea, and anorexia are better. Chemotherapy will be held today due to 2 neutropenia/thrombocytopenia. She will return for an office visit and cycle 4 FOLFOX in one week. The CA 19-9  will be checked in one week. She will return for an office visit and chemotherapy on 05/11/2012 .   Thornton Papas, MD  04/20/2012  2:05 PM

## 2012-04-22 ENCOUNTER — Telehealth: Payer: Self-pay | Admitting: Oncology

## 2012-04-22 NOTE — Telephone Encounter (Signed)
s/w her mother and she is awre of the appt time and will pu a sch on 5/16 aom

## 2012-04-28 ENCOUNTER — Ambulatory Visit (HOSPITAL_BASED_OUTPATIENT_CLINIC_OR_DEPARTMENT_OTHER): Payer: BC Managed Care – PPO

## 2012-04-28 ENCOUNTER — Other Ambulatory Visit: Payer: Self-pay | Admitting: Oncology

## 2012-04-28 ENCOUNTER — Other Ambulatory Visit (HOSPITAL_BASED_OUTPATIENT_CLINIC_OR_DEPARTMENT_OTHER): Payer: BC Managed Care – PPO | Admitting: Lab

## 2012-04-28 VITALS — BP 118/73 | HR 67 | Temp 98.2°F

## 2012-04-28 DIAGNOSIS — Z5111 Encounter for antineoplastic chemotherapy: Secondary | ICD-10-CM

## 2012-04-28 DIAGNOSIS — C251 Malignant neoplasm of body of pancreas: Secondary | ICD-10-CM

## 2012-04-28 DIAGNOSIS — C259 Malignant neoplasm of pancreas, unspecified: Secondary | ICD-10-CM

## 2012-04-28 DIAGNOSIS — C7800 Secondary malignant neoplasm of unspecified lung: Secondary | ICD-10-CM

## 2012-04-28 LAB — COMPREHENSIVE METABOLIC PANEL
ALT: 43 U/L — ABNORMAL HIGH (ref 0–35)
AST: 41 U/L — ABNORMAL HIGH (ref 0–37)
Albumin: 3.3 g/dL — ABNORMAL LOW (ref 3.5–5.2)
Alkaline Phosphatase: 186 U/L — ABNORMAL HIGH (ref 39–117)
BUN: 4 mg/dL — ABNORMAL LOW (ref 6–23)
Calcium: 9.2 mg/dL (ref 8.4–10.5)
Chloride: 102 mEq/L (ref 96–112)
Potassium: 3.8 mEq/L (ref 3.5–5.3)
Sodium: 138 mEq/L (ref 135–145)
Total Protein: 6.7 g/dL (ref 6.0–8.3)

## 2012-04-28 LAB — CBC WITH DIFFERENTIAL/PLATELET
BASO%: 0.5 % (ref 0.0–2.0)
EOS%: 2.9 % (ref 0.0–7.0)
HCT: 36.7 % (ref 34.8–46.6)
HGB: 12.2 g/dL (ref 11.6–15.9)
MCHC: 33.2 g/dL (ref 31.5–36.0)
MONO#: 0.8 10*3/uL (ref 0.1–0.9)
NEUT%: 37.1 % — ABNORMAL LOW (ref 38.4–76.8)
RDW: 18.1 % — ABNORMAL HIGH (ref 11.2–14.5)
WBC: 3.8 10*3/uL — ABNORMAL LOW (ref 3.9–10.3)
lymph#: 1.5 10*3/uL (ref 0.9–3.3)

## 2012-04-28 MED ORDER — DEXAMETHASONE SODIUM PHOSPHATE 10 MG/ML IJ SOLN
10.0000 mg | Freq: Once | INTRAMUSCULAR | Status: AC
  Administered 2012-04-28: 10 mg via INTRAVENOUS

## 2012-04-28 MED ORDER — PALONOSETRON HCL INJECTION 0.25 MG/5ML
0.2500 mg | Freq: Once | INTRAVENOUS | Status: AC
  Administered 2012-04-28: 0.25 mg via INTRAVENOUS

## 2012-04-28 MED ORDER — LEUCOVORIN CALCIUM INJECTION 350 MG
400.0000 mg/m2 | Freq: Once | INTRAVENOUS | Status: AC
  Administered 2012-04-28: 732 mg via INTRAVENOUS
  Filled 2012-04-28: qty 36.6

## 2012-04-28 MED ORDER — SODIUM CHLORIDE 0.9 % IV SOLN
2400.0000 mg/m2 | INTRAVENOUS | Status: DC
  Administered 2012-04-28: 4400 mg via INTRAVENOUS
  Filled 2012-04-28: qty 88

## 2012-04-28 MED ORDER — OXALIPLATIN CHEMO INJECTION 100 MG/20ML
65.0000 mg/m2 | Freq: Once | INTRAVENOUS | Status: AC
  Administered 2012-04-28: 120 mg via INTRAVENOUS
  Filled 2012-04-28: qty 24

## 2012-04-28 MED ORDER — DEXTROSE 5 % IV SOLN
Freq: Once | INTRAVENOUS | Status: AC
  Administered 2012-04-28: 12:00:00 via INTRAVENOUS

## 2012-04-28 MED ORDER — FLUOROURACIL CHEMO INJECTION 2.5 GM/50ML
400.0000 mg/m2 | Freq: Once | INTRAVENOUS | Status: AC
  Administered 2012-04-28: 750 mg via INTRAVENOUS
  Filled 2012-04-28: qty 15

## 2012-04-28 NOTE — Patient Instructions (Signed)
[facility] Discharge Instructions for Patients Receiving Chemotherapy  Today you received the following chemotherapy agents; Oxaliplatin, Leucovorin and Adrucil  To help prevent nausea and vomiting after your treatment, we encourage you to take your nausea medication. Begin taking it as often as prescribed by your physician.    If you develop nausea and vomiting that is not controlled by your nausea medication, call the clinic [phone]. If it is after clinic hours your family physician or the after hours number for the clinic or go to the Emergency Department.   BELOW ARE SYMPTOMS THAT SHOULD BE REPORTED IMMEDIATELY:  *FEVER GREATER THAN 100.5 F  *CHILLS WITH OR WITHOUT FEVER  NAUSEA AND VOMITING THAT IS NOT CONTROLLED WITH YOUR NAUSEA MEDICATION  *UNUSUAL SHORTNESS OF BREATH  *UNUSUAL BRUISING OR BLEEDING  TENDERNESS IN MOUTH AND THROAT WITH OR WITHOUT PRESENCE OF ULCERS  *URINARY PROBLEMS  *BOWEL PROBLEMS  UNUSUAL RASH Items with * indicate a potential emergency and should be followed up as soon as possible.  One of the nurses will contact you 24 hours after your treatment. Please let the nurse know about any problems that you may have experienced. Feel free to call the clinic you have any questions or concerns. The clinic phone number is [phone].   I have been informed and understand all the instructions given to me. I know to contact the clinic, my physician, or go to the Emergency Department if any problems should occur. I do not have any questions at this time, but understand that I may call the clinic during office hours   should I have any questions or need assistance in obtaining follow up care.    __________________________________________  _____________  __________ Signature of Patient or Authorized Representative            Date                   Time    __________________________________________ Nurse's Signature

## 2012-04-30 ENCOUNTER — Ambulatory Visit (HOSPITAL_BASED_OUTPATIENT_CLINIC_OR_DEPARTMENT_OTHER): Payer: BC Managed Care – PPO

## 2012-04-30 VITALS — BP 104/66 | HR 72 | Temp 97.3°F

## 2012-04-30 DIAGNOSIS — C3490 Malignant neoplasm of unspecified part of unspecified bronchus or lung: Secondary | ICD-10-CM

## 2012-04-30 DIAGNOSIS — C259 Malignant neoplasm of pancreas, unspecified: Secondary | ICD-10-CM

## 2012-05-04 ENCOUNTER — Encounter: Payer: BC Managed Care – PPO | Admitting: Nutrition

## 2012-05-04 ENCOUNTER — Inpatient Hospital Stay: Payer: BC Managed Care – PPO

## 2012-05-04 ENCOUNTER — Ambulatory Visit: Payer: BC Managed Care – PPO | Admitting: Nurse Practitioner

## 2012-05-04 ENCOUNTER — Other Ambulatory Visit: Payer: Self-pay | Admitting: *Deleted

## 2012-05-04 ENCOUNTER — Other Ambulatory Visit: Payer: BC Managed Care – PPO | Admitting: Lab

## 2012-05-04 DIAGNOSIS — C3490 Malignant neoplasm of unspecified part of unspecified bronchus or lung: Secondary | ICD-10-CM

## 2012-05-04 MED ORDER — ALPRAZOLAM 0.5 MG PO TABS: ORAL_TABLET | ORAL | Status: DC

## 2012-05-09 ENCOUNTER — Other Ambulatory Visit: Payer: Self-pay | Admitting: Oncology

## 2012-05-11 ENCOUNTER — Other Ambulatory Visit (HOSPITAL_BASED_OUTPATIENT_CLINIC_OR_DEPARTMENT_OTHER): Payer: BC Managed Care – PPO | Admitting: Lab

## 2012-05-11 ENCOUNTER — Telehealth: Payer: Self-pay | Admitting: Oncology

## 2012-05-11 ENCOUNTER — Ambulatory Visit (HOSPITAL_BASED_OUTPATIENT_CLINIC_OR_DEPARTMENT_OTHER): Payer: BC Managed Care – PPO | Admitting: Oncology

## 2012-05-11 VITALS — BP 100/71 | HR 72 | Temp 97.7°F | Ht 61.0 in | Wt 156.7 lb

## 2012-05-11 DIAGNOSIS — C259 Malignant neoplasm of pancreas, unspecified: Secondary | ICD-10-CM

## 2012-05-11 DIAGNOSIS — T451X5A Adverse effect of antineoplastic and immunosuppressive drugs, initial encounter: Secondary | ICD-10-CM

## 2012-05-11 DIAGNOSIS — D702 Other drug-induced agranulocytosis: Secondary | ICD-10-CM

## 2012-05-11 DIAGNOSIS — D6959 Other secondary thrombocytopenia: Secondary | ICD-10-CM

## 2012-05-11 LAB — CBC WITH DIFFERENTIAL/PLATELET
Basophils Absolute: 0 10*3/uL (ref 0.0–0.1)
EOS%: 2.4 % (ref 0.0–7.0)
HCT: 34.3 % — ABNORMAL LOW (ref 34.8–46.6)
HGB: 11.5 g/dL — ABNORMAL LOW (ref 11.6–15.9)
LYMPH%: 30.6 % (ref 14.0–49.7)
MCH: 30.6 pg (ref 25.1–34.0)
NEUT%: 46.9 % (ref 38.4–76.8)
Platelets: 76 10*3/uL — ABNORMAL LOW (ref 145–400)
lymph#: 1 10*3/uL (ref 0.9–3.3)

## 2012-05-11 LAB — COMPREHENSIVE METABOLIC PANEL
Albumin: 3.2 g/dL — ABNORMAL LOW (ref 3.5–5.2)
Alkaline Phosphatase: 153 U/L — ABNORMAL HIGH (ref 39–117)
BUN: 4 mg/dL — ABNORMAL LOW (ref 6–23)
Calcium: 9.1 mg/dL (ref 8.4–10.5)
Chloride: 100 mEq/L (ref 96–112)
Glucose, Bld: 148 mg/dL — ABNORMAL HIGH (ref 70–99)
Potassium: 3.8 mEq/L (ref 3.5–5.3)

## 2012-05-11 NOTE — Telephone Encounter (Signed)
Gv pt appt for [id number].  scheduled ct scan on 06/18 @ [facility]

## 2012-05-11 NOTE — Progress Notes (Signed)
   [facility]    OFFICE PROGRESS NOTE   INTERVAL HISTORY:   She returns as scheduled. She completed cycle 4 of FOLFOX on 04/28/2012. She reports nausea following chemotherapy. The pain is much better. No mouth sores or diarrhea. She had an episode of nausea after eating a "hamburger ". No neuropathy symptoms.  Objective:  Vital signs in last 24 hours:  Blood pressure 100/71, pulse 72, temperature 97.7 F (36.5 C), temperature source Oral, height 5\' 1"  (1.549 m), weight 156 lb 11.2 oz (71.079 kg).    HEENT: No thrush or ulcers Resp: Lungs clear bilaterally Cardio: Regular rate and rhythm GI: Mild tenderness in the mid and left upper abdomen. No mass. No hepatomegaly Vascular: No leg    Portacath/PICC-without erythema  Lab Results:  Lab Results  Component Value Date   WBC 3.2* 05/11/2012   HGB 11.5* 05/11/2012   HCT 34.3* 05/11/2012   MCV 91.3 05/11/2012   PLT 76* 05/11/2012   ANC 1.5   Medications: I have reviewed the patient's current medications.  Assessment/Plan: 1. Locally advanced pancreas cancer, adenosquamous carcinoma, presenting with a pancreas body mass and CT/ultrasound evidence of portal vein/SMV invasion. She completed cycle 1 of FOLFOX 03/10/2012.  2. Delayed nausea following cycle 1 of FOLFOX. She receives Aloxi premedication. She takes prophylactic dexamethasone 8 mg twice daily for 2 days beginning day 2. 3. Pain secondary to the locally advanced pancreas mass. Improved 4. Small cell lung cancer. Left perihilar/lung mass was stable on chest x-ray 04/05/2012. 5. Staging PET scan 02/12/2012 confirming a hypermetabolic left lung mass, hypermetabolic pancreas mass, hypermetabolic peritoneal implant. Status post bronchoscopy confirming a left endobronchial mass and left hilar mass on 02/24/2012. Biopsy of both areas confirmed small cell carcinoma. 6. Baseline nausea. Question related to tumor abutting the stomach. Improved 7. Constipation. She  continues daily MiraLAX 8. Neutropenia/thrombocytopenia secondary to chemotherapy-chemotherapy will be held today due to thrombocytopenia.  Disposition:  Her performance status has improved. She will return for cycle 5 of FOLFOX on 05/19/2012.  She will undergo a restaging CT evaluation prior to a followup office visit on 06/02/2012. The CA 19-9 will be repeated on 05/19/2012.  She reports "itching "after IV contrast with the most recent CT. We will ask radiology for recommendations regarding premedication for the next CT.   Thornton Papas, MD  05/11/2012  5:19 PM

## 2012-05-12 ENCOUNTER — Other Ambulatory Visit: Payer: BC Managed Care – PPO | Admitting: Lab

## 2012-05-12 ENCOUNTER — Inpatient Hospital Stay: Payer: BC Managed Care – PPO

## 2012-05-13 ENCOUNTER — Other Ambulatory Visit: Payer: Self-pay | Admitting: *Deleted

## 2012-05-13 DIAGNOSIS — C3490 Malignant neoplasm of unspecified part of unspecified bronchus or lung: Secondary | ICD-10-CM

## 2012-05-13 MED ORDER — ALPRAZOLAM 0.5 MG PO TABS: ORAL_TABLET | ORAL | Status: DC

## 2012-05-13 NOTE — Telephone Encounter (Signed)
Requesting refill on alprazolam 0.5 mg to take tid. Last refill was for #20 tabs on 5/22. She reports she needs to take the med three times daily for her anxiety level. Requests call in to [facility] on [address].

## 2012-05-18 ENCOUNTER — Other Ambulatory Visit: Payer: Self-pay | Admitting: Oncology

## 2012-05-19 ENCOUNTER — Other Ambulatory Visit: Payer: Self-pay | Admitting: Oncology

## 2012-05-19 ENCOUNTER — Ambulatory Visit (HOSPITAL_BASED_OUTPATIENT_CLINIC_OR_DEPARTMENT_OTHER): Payer: BC Managed Care – PPO

## 2012-05-19 ENCOUNTER — Other Ambulatory Visit (HOSPITAL_BASED_OUTPATIENT_CLINIC_OR_DEPARTMENT_OTHER): Payer: BC Managed Care – PPO | Admitting: Lab

## 2012-05-19 VITALS — BP 115/77 | HR 72 | Temp 97.3°F

## 2012-05-19 DIAGNOSIS — C259 Malignant neoplasm of pancreas, unspecified: Secondary | ICD-10-CM

## 2012-05-19 DIAGNOSIS — Z5111 Encounter for antineoplastic chemotherapy: Secondary | ICD-10-CM

## 2012-05-19 DIAGNOSIS — C251 Malignant neoplasm of body of pancreas: Secondary | ICD-10-CM

## 2012-05-19 DIAGNOSIS — C7800 Secondary malignant neoplasm of unspecified lung: Secondary | ICD-10-CM

## 2012-05-19 LAB — COMPREHENSIVE METABOLIC PANEL
AST: 39 U/L — ABNORMAL HIGH (ref 0–37)
Albumin: 3.5 g/dL (ref 3.5–5.2)
Alkaline Phosphatase: 222 U/L — ABNORMAL HIGH (ref 39–117)
BUN: 4 mg/dL — ABNORMAL LOW (ref 6–23)
Creatinine, Ser: 0.59 mg/dL (ref 0.50–1.10)
Potassium: 3.8 mEq/L (ref 3.5–5.3)
Total Bilirubin: 0.4 mg/dL (ref 0.3–1.2)

## 2012-05-19 LAB — CBC WITH DIFFERENTIAL/PLATELET
BASO%: 0.6 % (ref 0.0–2.0)
EOS%: 2.1 % (ref 0.0–7.0)
HCT: 37.3 % (ref 34.8–46.6)
MCH: 29.9 pg (ref 25.1–34.0)
MCHC: 33.2 g/dL (ref 31.5–36.0)
MCV: 89.9 fL (ref 79.5–101.0)
MONO%: 20.1 % — ABNORMAL HIGH (ref 0.0–14.0)
NEUT#: 1.5 10*3/uL (ref 1.5–6.5)
NEUT%: 43.9 % (ref 38.4–76.8)
WBC: 3.3 10*3/uL — ABNORMAL LOW (ref 3.9–10.3)
lymph#: 1.1 10*3/uL (ref 0.9–3.3)

## 2012-05-19 MED ORDER — LEUCOVORIN CALCIUM INJECTION 350 MG
400.0000 mg/m2 | Freq: Once | INTRAVENOUS | Status: AC
  Administered 2012-05-19: 732 mg via INTRAVENOUS
  Filled 2012-05-19: qty 36.6

## 2012-05-19 MED ORDER — OXALIPLATIN CHEMO INJECTION 100 MG/20ML
55.0000 mg/m2 | Freq: Once | INTRAVENOUS | Status: AC
  Administered 2012-05-19: 100 mg via INTRAVENOUS
  Filled 2012-05-19: qty 20

## 2012-05-19 MED ORDER — PALONOSETRON HCL INJECTION 0.25 MG/5ML
0.2500 mg | Freq: Once | INTRAVENOUS | Status: AC
  Administered 2012-05-19: 0.25 mg via INTRAVENOUS

## 2012-05-19 MED ORDER — SODIUM CHLORIDE 0.9 % IV SOLN
2400.0000 mg/m2 | INTRAVENOUS | Status: DC
  Administered 2012-05-19: 4400 mg via INTRAVENOUS
  Filled 2012-05-19: qty 88

## 2012-05-19 MED ORDER — FLUOROURACIL CHEMO INJECTION 2.5 GM/50ML
400.0000 mg/m2 | Freq: Once | INTRAVENOUS | Status: AC
  Administered 2012-05-19: 750 mg via INTRAVENOUS
  Filled 2012-05-19: qty 15

## 2012-05-19 MED ORDER — DEXTROSE 5 % IV SOLN
Freq: Once | INTRAVENOUS | Status: AC
  Administered 2012-05-19: 10:00:00 via INTRAVENOUS

## 2012-05-19 MED ORDER — DEXAMETHASONE SODIUM PHOSPHATE 10 MG/ML IJ SOLN
10.0000 mg | Freq: Once | INTRAMUSCULAR | Status: AC
  Administered 2012-05-19: 10 mg via INTRAVENOUS

## 2012-05-19 NOTE — Patient Instructions (Signed)
[facility] Discharge Instructions for Patients Receiving Chemotherapy  Today you received the following chemotherapy agents Oxaliplatin, Leucovorin, and 5FU.  To help prevent nausea and vomiting after your treatment, we encourage you to take your nausea medication as prescribed.   If you develop nausea and vomiting that is not controlled by your nausea medication, call the clinic. If it is after clinic hours your family physician or the after hours number for the clinic or go to the Emergency Department.   BELOW ARE SYMPTOMS THAT SHOULD BE REPORTED IMMEDIATELY:  *FEVER GREATER THAN 100.5 F  *CHILLS WITH OR WITHOUT FEVER  NAUSEA AND VOMITING THAT IS NOT CONTROLLED WITH YOUR NAUSEA MEDICATION  *UNUSUAL SHORTNESS OF BREATH  *UNUSUAL BRUISING OR BLEEDING  TENDERNESS IN MOUTH AND THROAT WITH OR WITHOUT PRESENCE OF ULCERS  *URINARY PROBLEMS  *BOWEL PROBLEMS  UNUSUAL RASH Items with * indicate a potential emergency and should be followed up as soon as possible.  One of the nurses will contact you 24 hours after your treatment. Please let the nurse know about any problems that you may have experienced. Feel free to call the clinic you have any questions or concerns. The clinic phone number is [phone].   I have been informed and understand all the instructions given to me. I know to contact the clinic, my physician, or go to the Emergency Department if any problems should occur. I do not have any questions at this time, but understand that I may call the clinic during office hours   should I have any questions or need assistance in obtaining follow up care.    __________________________________________  _____________  __________ Signature of Patient or Authorized Representative            Date                   Time    __________________________________________ Nurse's Signature    

## 2012-05-21 ENCOUNTER — Ambulatory Visit (HOSPITAL_BASED_OUTPATIENT_CLINIC_OR_DEPARTMENT_OTHER): Payer: BC Managed Care – PPO

## 2012-05-21 VITALS — BP 134/81 | HR 80 | Temp 97.5°F

## 2012-05-21 DIAGNOSIS — C251 Malignant neoplasm of body of pancreas: Secondary | ICD-10-CM

## 2012-05-21 DIAGNOSIS — C259 Malignant neoplasm of pancreas, unspecified: Secondary | ICD-10-CM

## 2012-05-21 DIAGNOSIS — C7800 Secondary malignant neoplasm of unspecified lung: Secondary | ICD-10-CM

## 2012-05-21 MED ORDER — HEPARIN SOD (PORK) LOCK FLUSH 100 UNIT/ML IV SOLN
500.0000 [IU] | Freq: Once | INTRAVENOUS | Status: AC | PRN
  Administered 2012-05-21: 500 [IU]
  Filled 2012-05-21: qty 5

## 2012-05-21 MED ORDER — SODIUM CHLORIDE 0.9 % IJ SOLN
10.0000 mL | INTRAMUSCULAR | Status: DC | PRN
  Administered 2012-05-21: 10 mL
  Filled 2012-05-21: qty 10

## 2012-05-24 ENCOUNTER — Telehealth: Payer: Self-pay | Admitting: *Deleted

## 2012-05-24 DIAGNOSIS — B370 Candidal stomatitis: Secondary | ICD-10-CM

## 2012-05-24 MED ORDER — FLUCONAZOLE 100 MG PO TABS: 100.0000 mg | ORAL_TABLET | Freq: Every day | ORAL | Status: DC

## 2012-05-24 NOTE — Telephone Encounter (Signed)
Received a call from the  patient with complaints of throat soreness and pain.  She states she has a white tongue and that it had patches on it yesterday.  She states she gargled with salt water yesterday.  She states she is drinking fluids and the ensure supplements without difficulty.  She complains of extreme fatigue.  She denies fever, cough, but states her head has felt "stopped up".  This information was given to Dr. Truett Perna and his nurse.  Reminded patient to call if there were any other changes and to expect a call back from the office nurse.  Patient verbalized understanding.

## 2012-05-24 NOTE — Telephone Encounter (Signed)
Per Dr. Truett Perna: Try OTC Chloraseptic spray and take Diflucan 100 mg daily x 5 days. Patient notified.

## 2012-05-31 ENCOUNTER — Ambulatory Visit (HOSPITAL_COMMUNITY)
Admission: RE | Admit: 2012-05-31 | Discharge: 2012-05-31 | Disposition: A | Discharge status: Home / Self Care | Payer: BC Managed Care – PPO | Source: Ambulatory Visit | Attending: Oncology | Admitting: Oncology

## 2012-05-31 ENCOUNTER — Other Ambulatory Visit: Payer: Self-pay

## 2012-05-31 DIAGNOSIS — C259 Malignant neoplasm of pancreas, unspecified: Secondary | ICD-10-CM

## 2012-05-31 DIAGNOSIS — C3490 Malignant neoplasm of unspecified part of unspecified bronchus or lung: Secondary | ICD-10-CM | POA: Insufficient documentation

## 2012-05-31 DIAGNOSIS — I38 Endocarditis, valve unspecified: Secondary | ICD-10-CM | POA: Insufficient documentation

## 2012-05-31 DIAGNOSIS — K7689 Other specified diseases of liver: Secondary | ICD-10-CM | POA: Insufficient documentation

## 2012-05-31 DIAGNOSIS — C251 Malignant neoplasm of body of pancreas: Secondary | ICD-10-CM | POA: Insufficient documentation

## 2012-05-31 DIAGNOSIS — I2510 Atherosclerotic heart disease of native coronary artery without angina pectoris: Secondary | ICD-10-CM | POA: Insufficient documentation

## 2012-05-31 DIAGNOSIS — K869 Disease of pancreas, unspecified: Secondary | ICD-10-CM | POA: Insufficient documentation

## 2012-05-31 DIAGNOSIS — K449 Diaphragmatic hernia without obstruction or gangrene: Secondary | ICD-10-CM | POA: Insufficient documentation

## 2012-05-31 DIAGNOSIS — R599 Enlarged lymph nodes, unspecified: Secondary | ICD-10-CM | POA: Insufficient documentation

## 2012-05-31 DIAGNOSIS — I700 Atherosclerosis of aorta: Secondary | ICD-10-CM | POA: Insufficient documentation

## 2012-05-31 MED ORDER — OXYCODONE HCL 10 MG PO TB12: 10.0000 mg | ORAL_TABLET | Freq: Two times a day (BID) | ORAL | Status: DC

## 2012-05-31 MED ORDER — IOHEXOL 300 MG/ML  SOLN
100.0000 mL | Freq: Once | INTRAMUSCULAR | Status: AC | PRN
  Administered 2012-05-31: 100 mL via INTRAVENOUS

## 2012-06-01 ENCOUNTER — Other Ambulatory Visit: Payer: Self-pay | Admitting: Oncology

## 2012-06-02 ENCOUNTER — Telehealth: Payer: Self-pay | Admitting: *Deleted

## 2012-06-02 ENCOUNTER — Other Ambulatory Visit: Payer: Self-pay | Admitting: Oncology

## 2012-06-02 ENCOUNTER — Ambulatory Visit (HOSPITAL_BASED_OUTPATIENT_CLINIC_OR_DEPARTMENT_OTHER): Payer: BC Managed Care – PPO | Admitting: Nurse Practitioner

## 2012-06-02 ENCOUNTER — Other Ambulatory Visit (HOSPITAL_BASED_OUTPATIENT_CLINIC_OR_DEPARTMENT_OTHER): Payer: BC Managed Care – PPO | Admitting: Lab

## 2012-06-02 ENCOUNTER — Ambulatory Visit (HOSPITAL_BASED_OUTPATIENT_CLINIC_OR_DEPARTMENT_OTHER): Payer: BC Managed Care – PPO

## 2012-06-02 ENCOUNTER — Telehealth: Payer: Self-pay | Admitting: Oncology

## 2012-06-02 ENCOUNTER — Ambulatory Visit: Payer: BC Managed Care – PPO | Admitting: Nutrition

## 2012-06-02 VITALS — BP 114/66 | HR 62 | Temp 97.0°F | Ht 61.0 in | Wt 152.6 lb

## 2012-06-02 DIAGNOSIS — D702 Other drug-induced agranulocytosis: Secondary | ICD-10-CM

## 2012-06-02 DIAGNOSIS — C259 Malignant neoplasm of pancreas, unspecified: Secondary | ICD-10-CM

## 2012-06-02 DIAGNOSIS — D6959 Other secondary thrombocytopenia: Secondary | ICD-10-CM

## 2012-06-02 DIAGNOSIS — C251 Malignant neoplasm of body of pancreas: Secondary | ICD-10-CM

## 2012-06-02 DIAGNOSIS — C3490 Malignant neoplasm of unspecified part of unspecified bronchus or lung: Secondary | ICD-10-CM

## 2012-06-02 DIAGNOSIS — Z5111 Encounter for antineoplastic chemotherapy: Secondary | ICD-10-CM

## 2012-06-02 LAB — CBC WITH DIFFERENTIAL/PLATELET
Eosinophils Absolute: 0 10*3/uL (ref 0.0–0.5)
MCV: 93.9 fL (ref 79.5–101.0)
MONO%: 19.4 % — ABNORMAL HIGH (ref 0.0–14.0)
NEUT#: 1.3 10*3/uL — ABNORMAL LOW (ref 1.5–6.5)
RBC: 3.71 10*6/uL (ref 3.70–5.45)
RDW: 19.7 % — ABNORMAL HIGH (ref 11.2–14.5)
WBC: 3.1 10*3/uL — ABNORMAL LOW (ref 3.9–10.3)
lymph#: 1.2 10*3/uL (ref 0.9–3.3)

## 2012-06-02 LAB — COMPREHENSIVE METABOLIC PANEL
AST: 36 U/L (ref 0–37)
Albumin: 3.2 g/dL — ABNORMAL LOW (ref 3.5–5.2)
Alkaline Phosphatase: 148 U/L — ABNORMAL HIGH (ref 39–117)
Chloride: 99 mEq/L (ref 96–112)
Glucose, Bld: 122 mg/dL — ABNORMAL HIGH (ref 70–99)
Potassium: 3.6 mEq/L (ref 3.5–5.3)
Sodium: 136 mEq/L (ref 135–145)
Total Protein: 6.4 g/dL (ref 6.0–8.3)

## 2012-06-02 MED ORDER — SODIUM CHLORIDE 0.9 % IJ SOLN
10.0000 mL | INTRAMUSCULAR | Status: DC | PRN
  Filled 2012-06-02: qty 10

## 2012-06-02 MED ORDER — FLUOROURACIL CHEMO INJECTION 5 GM/100ML
2400.0000 mg/m2 | INTRAVENOUS | Status: DC
  Administered 2012-06-02: 4400 mg via INTRAVENOUS
  Filled 2012-06-02: qty 88

## 2012-06-02 MED ORDER — DEXTROSE 5 % IV SOLN
Freq: Once | INTRAVENOUS | Status: AC
  Administered 2012-06-02: 12:00:00 via INTRAVENOUS

## 2012-06-02 MED ORDER — PALONOSETRON HCL INJECTION 0.25 MG/5ML
0.2500 mg | Freq: Once | INTRAVENOUS | Status: AC
  Administered 2012-06-02: 0.25 mg via INTRAVENOUS

## 2012-06-02 MED ORDER — LEUCOVORIN CALCIUM INJECTION 350 MG
400.0000 mg/m2 | Freq: Once | INTRAVENOUS | Status: AC
  Administered 2012-06-02: 732 mg via INTRAVENOUS
  Filled 2012-06-02: qty 36.6

## 2012-06-02 MED ORDER — DEXAMETHASONE SODIUM PHOSPHATE 10 MG/ML IJ SOLN
10.0000 mg | Freq: Once | INTRAMUSCULAR | Status: AC
  Administered 2012-06-02: 10 mg via INTRAVENOUS

## 2012-06-02 MED ORDER — FLUOROURACIL CHEMO INJECTION 2.5 GM/50ML
400.0000 mg/m2 | Freq: Once | INTRAVENOUS | Status: AC
  Administered 2012-06-02: 750 mg via INTRAVENOUS
  Filled 2012-06-02: qty 15

## 2012-06-02 MED ORDER — HEPARIN SOD (PORK) LOCK FLUSH 100 UNIT/ML IV SOLN
500.0000 [IU] | Freq: Once | INTRAVENOUS | Status: DC | PRN
  Filled 2012-06-02: qty 5

## 2012-06-02 MED ORDER — OXALIPLATIN CHEMO INJECTION 100 MG/20ML
45.0000 mg/m2 | Freq: Once | INTRAVENOUS | Status: AC
  Administered 2012-06-02: 80 mg via INTRAVENOUS
  Filled 2012-06-02: qty 16

## 2012-06-02 NOTE — Progress Notes (Signed)
OFFICE PROGRESS NOTE  Interval history:  Victoria Price returns as scheduled. She completed cycle 5 of FOLFOX on 05/19/2012. She had nausea but no vomiting. She noted loose stools following the chemotherapy and then again after drinking the contrast for the CT scan. She had a normal bowel movement this morning. She developed "thrush" recently. She completed a course of Diflucan. She noted the cold sensitivity until early this week. She denies persistent neuropathy symptoms. Over the past few weeks she has had increased pain at the left lateral abdomen. She is taking OxyContin with oxycodone as needed.   Objective: Blood pressure 114/66, pulse 62, temperature 97 F (36.1 C), temperature source Oral, height 5\' 1"  (1.549 m), weight 152 lb 9.6 oz (69.219 kg).  Oropharynx is without thrush or ulceration. Lungs are clear. Regular cardiac rhythm. Port-A-Cath site is without erythema. Abdomen is soft. Tender at the left upper lateral abdomen. No hepatomegaly. Extremities are without edema.  Lab Results: Lab Results  Component Value Date   WBC 3.1* 06/02/2012   HGB 11.6 06/02/2012   HCT 34.9 06/02/2012   MCV 93.9 06/02/2012   PLT 82* 06/02/2012    Chemistry:    Chemistry      Component Value Date/Time   NA 136 06/02/2012 0916   K 3.6 06/02/2012 0916   CL 99 06/02/2012 0916   CO2 30 06/02/2012 0916   BUN 5* 06/02/2012 0916   CREATININE 0.65 06/02/2012 0916      Component Value Date/Time   CALCIUM 9.2 06/02/2012 0916   ALKPHOS 148* 06/02/2012 0916   AST 36 06/02/2012 0916   ALT 41* 06/02/2012 0916   BILITOT 0.3 06/02/2012 0916       Studies/Results: Ct Chest W Contrast  05/31/2012  *RADIOLOGY REPORT*  Clinical Data:  56 year old female with history of lung and pancreatic cancer.  Restaging scan.  CT CHEST AND ABDOMEN WITH CONTRAST  Technique:  Multidetector CT imaging of the chest and abdomen was performed following the standard protocol during bolus administration of intravenous contrast.  Contrast:  OMNIPAQUE IOHEXOL 300 MG/ML  SOLN  Comparison:  CT of chest 03/04/2012.  PET CT 02/12/2012.  CT CHEST  Findings:  Mediastinum: Enlarged low left paratracheal lymph node measuring 1.4 cm in short axis is unchanged.  Extensive left hilar wrap the adenopathy is contiguous with the adjacent left lung mass.  No right-sided mediastinal adenopathy or right hilar adenopathy noted on today's examination. There is a small hiatal hernia. Heart size is normal. There is no significant pericardial fluid, thickening or pericardial calcification.  Calcifications of the aortic valve. There is atherosclerosis of the thoracic aorta, the great vessels of the mediastinum and the coronary arteries, including calcified atherosclerotic plaque in the left main and left anterior descending. coronary arteries.  Right internal jugular single lumen Port-A-Cath with tip terminating in the superior cavoatrial junction.  Lungs/Pleura: Previously noted left-sided perihilar mass is very similar in size and appearance compared to the prior study.  This lesion is again very difficult to measure secondary to its irregular shape, however, it measures approximately 5.2 x 4.2 cm and image 39 of series 6, and 5.8 x 4.9 cm on image 44 of series 6; essentially unchanged.  This lesion appears to occupy portions of the posterior aspect of the left upper lobe as well as the superior segment of the left lower lobe (i.e., a violates the left major fissure).  There are a small amount of adjacent postobstructive changes within the superior segment of the left  lower lobe, and on image 31 of series 7, there is a satellite nodule which is similar in size measuring 11 x 13 mm.  Mild right apical pleuroparenchymal thickening is somewhat nodular in appearance, but unchanged compared to the prior study, and most consistent with scarring.  No acute consolidative air space disease.  No pleural effusions.  Musculoskeletal: There are no aggressive appearing lytic or  blastic lesions noted in the visualized portions of the skeleton.  IMPRESSION:  1.  No significant interval change in size and appearance of the large left perihilar mass which involves portions of the left upper lobe and superior segment of the left lower lobe, with small adjacent satellite nodule in the left lower lobe, extensive left hilar adenopathy and low left paratracheal adenopathy, as detailed above.  No new evidence of metastatic disease in the thorax. 2. Atherosclerosis, including left main and left anterior descending coronary artery disease. Please note that although the presence of coronary artery calcium documents the presence of coronary artery disease, the severity of this disease and any potential stenosis cannot be assessed on this non-gated CT examination.  CT ABDOMEN  Findings:  Abdomen: When compared to prior examination from 03/04/2012, the previously noted hypovascular mass in the body of the pancreas appears smaller and is now heterogeneous in appearance and highly irregular in shape making accurate measurements difficult. However, this lesion has grown toward the base of the superior mesenteric artery where it makes a broad contact (nearly 50% circumference) with the anterior aspect of the superior mesenteric artery, suggesting some early vascular invasion.  This mass is also in close contact with the splenic artery, and the undersurface of the right hepatic artery.  There is a replaced left hepatic artery from the left gastric.  This mass now occupies the neck and proximal body of the pancreas.  The distal body of the pancreas appears atrophic, and the distal pancreatic duct is dilated.  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  There is a large filling defect which emanates from the  pancreatic mass extending into the portal vein and into the right main branch, likely to represent tumor thrombus.  There is a subcentimeter low-attenuation lesion within segment 7 of the liver (image 68 of series six) which is unchanged in retrospect compared to the prior study but is too small to characterize.  The remainder of the liver is otherwise unremarkable in appearance. Appearance of the gallbladder, bilateral adrenal glands and bilateral kidneys is unremarkable.  The spleen is borderline enlarged (11.6 cm in AP dimension), likely from venous congestion.  Mild edema is noted throughout the root of the small bowel mesenteric, and there is ill-defined soft tissue view extending inferiorly from the pancreatic mass into the root of the small bowel mesentery; findings that could reflect direct invasion, or could simply reflect venolymphatic congestion.  Innumerable small lymph nodes are seen throughout the root of the small bowel mesentery.  No ascites or pneumoperitoneum.  No pathologic distension of bowel.  Mild atherosclerosis is noted throughout the abdominal aorta without evidence of aneurysm or dissection.  Musculoskeletal: There are no aggressive appearing lytic or blastic lesions noted in the visualized portions of the skeleton.  IMPRESSION:  1.  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 2.  Appearance of the root of the small bowel mesentery may reflect direct invasion from the mass, or could simply be secondary to venolymphatic congestion. 3.  Additional incidental  findings, as above.  Original Report Authenticated By: Florencia Reasons, M.D.   Ct Abdomen W Contrast  05/31/2012  *RADIOLOGY REPORT*  Clinical Data:  56 year old female with history of lung and pancreatic cancer.  Restaging scan.  CT CHEST AND ABDOMEN WITH CONTRAST  Technique:  Multidetector CT imaging of the chest and abdomen was performed following the standard protocol during bolus administration of intravenous contrast.  Contrast: OMNIPAQUE IOHEXOL 300 MG/ML  SOLN  Comparison:  CT of chest 03/04/2012.  PET CT 02/12/2012.  CT CHEST  Findings:  Mediastinum: Enlarged low left paratracheal lymph node measuring 1.4 cm in short axis is unchanged.  Extensive left hilar wrap the adenopathy is contiguous with the adjacent left lung mass.  No right-sided mediastinal adenopathy or right hilar adenopathy noted on today's examination. There is a small hiatal hernia. Heart size is normal. There is no significant pericardial fluid, thickening or pericardial calcification.  Calcifications of the aortic valve. There is atherosclerosis of the thoracic aorta, the great vessels of the mediastinum and the coronary arteries, including calcified atherosclerotic plaque in the left main and left anterior descending. coronary arteries.  Right internal jugular single lumen Port-A-Cath with tip terminating in the superior cavoatrial junction.  Lungs/Pleura: Previously noted left-sided perihilar mass is very similar in size and appearance compared to the prior study.  This lesion is again very difficult to measure secondary to its irregular shape, however, it measures approximately 5.2 x 4.2 cm and image 39 of series 6, and 5.8 x 4.9 cm on image 44 of series 6; essentially unchanged.  This lesion appears to occupy portions of the posterior aspect of the left upper lobe as well as the superior segment of the left lower lobe (i.e., a violates the left major fissure).  There are a small amount of adjacent postobstructive changes  within the superior segment of the left lower lobe, and on image 31 of series 7, there is a satellite nodule which is similar in size measuring 11 x 13 mm.  Mild right apical pleuroparenchymal thickening is somewhat nodular in appearance, but unchanged compared to the prior study, and most consistent with scarring.  No acute consolidative air space disease.  No pleural effusions.  Musculoskeletal: There are no aggressive appearing lytic or blastic lesions noted in the visualized portions of the skeleton.  IMPRESSION:  1.  No significant interval change in size and appearance of the large left perihilar mass which involves portions of the left upper lobe and superior segment of the left lower lobe, with small adjacent satellite nodule in the left lower lobe, extensive left hilar adenopathy and low left paratracheal adenopathy, as detailed above.  No new evidence of metastatic disease in the thorax. 2. Atherosclerosis, including left main and left anterior descending coronary artery disease. Please note that although the presence of coronary artery calcium documents the presence of coronary artery disease, the severity of this disease and any potential stenosis cannot be assessed on this non-gated CT examination.  CT ABDOMEN  Findings:  Abdomen: When compared to prior examination from 03/04/2012, the previously noted hypovascular mass in the body of the pancreas appears smaller and is now heterogeneous in appearance and highly irregular in shape making accurate measurements difficult. However, this lesion has grown toward the base of the superior mesenteric artery where it makes a broad contact (nearly 50% circumference) with the anterior aspect of the superior mesenteric artery, suggesting some early vascular invasion.  This mass is also in close contact with the splenic artery, and the undersurface of the right hepatic artery.  There is a replaced left hepatic artery from the left gastric.  This mass now occupies  the neck and proximal body of the pancreas.  The distal body of the pancreas appears atrophic, and the distal pancreatic duct is dilated.  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  There is a large filling defect which emanates from the pancreatic mass extending into the portal vein and into the right main branch, likely to represent tumor thrombus.  There is a subcentimeter low-attenuation lesion within segment 7 of the liver (image 68 of series six) which is unchanged in retrospect compared to the prior study but is too small to characterize.  The remainder of the liver is otherwise unremarkable in appearance. Appearance of the gallbladder, bilateral adrenal glands and bilateral kidneys is unremarkable.  The spleen is borderline enlarged (11.6 cm in AP dimension), likely from venous congestion.  Mild edema is noted throughout the root of the small bowel mesenteric, and there is ill-defined soft tissue view extending inferiorly from the pancreatic mass into the root of the small bowel mesentery; findings that could reflect direct invasion, or could simply reflect venolymphatic congestion.  Innumerable small lymph nodes are seen throughout the root of the small bowel mesentery.  No ascites or pneumoperitoneum.  No pathologic distension of bowel.  Mild atherosclerosis is noted throughout the abdominal aorta without evidence of aneurysm or dissection.  Musculoskeletal: There are no aggressive appearing lytic or blastic lesions noted in the visualized portions of the skeleton.  IMPRESSION:  1.  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 2.  Appearance of the root of the small bowel mesentery may reflect direct invasion from the mass, or could simply be secondary to venolymphatic congestion. 3.  Additional incidental findings, as above.  Original Report Authenticated By: Florencia Reasons, M.D.    Medications: I have reviewed the patient's current medications.  Assessment/Plan:  1. Locally advanced pancreas cancer, adenosquamous carcinoma, presenting with a pancreas body mass and CT/ultrasound evidence of portal vein/SMV invasion. She completed cycle 1 of FOLFOX 03/10/2012. She completed cycle 5 on 05/19/2012. CA 19-9 was improved on 05/19/2012. Restaging CT scan 05/31/2012 showed a decrease in the size of the pancreatic mass. 2. Delayed nausea following cycle 1 of FOLFOX. She receives Aloxi premedication. She takes prophylactic dexamethasone 8 mg twice daily for 2 days beginning day 2. No nausea following cycle 3 FOLFOX. 3. Pain secondary  to the locally advanced pancreas mass. The pain overall is improved. She has recently noted a mild increase in the pain. She continues OxyContin with oxycodone as needed. 4. Small cell lung cancer. Left perihilar/lung mass was stable on chest x-ray 04/05/2012 and on CT 05/31/2012. 5. Staging PET scan 02/12/2012 confirming a hypermetabolic left lung mass, hypermetabolic pancreas mass, hypermetabolic peritoneal implant. Status post bronchoscopy confirming a left endobronchial mass and left hilar mass on 02/24/2012. Biopsy of both areas confirmed small cell carcinoma. 6. Baseline nausea. Question related to tumor abutting the stomach. Improved. 7. Constipation. She continues daily MiraLAX. 8. Neutropenia/thrombocytopenia secondary to chemotherapy. Plan to  proceed with treatment today as scheduled with a 20% dose reduction of the oxaliplatin.  Disposition-Ms. Borkowski appears stable. She has completed 5 cycles of FOLFOX chemotherapy. Restaging CT scans 05/31/2012 showed the pancreatic mass is smaller and the lung mass is stable. Dr. Truett Perna recommends continuation of FOLFOX chemotherapy as well as a referral to radiation oncology for consideration of radiation to the lung mass. Plan to proceed with cycle 6 today as scheduled with a 20% dose reduction of the oxaliplatin due to mild neutropenia and thrombocytopenia. She will return for a followup visit and cycle 7 FOLFOX in 3 weeks. She will contact the office in the interim with any problems.  Patient seen with Dr. Truett Perna.  Lonna Cobb ANP/GNP-BC

## 2012-06-02 NOTE — Progress Notes (Signed)
Elvina Sidle indicated that Dr. Truett Perna wanted to treat the patient despite low platelets and neutropenia.

## 2012-06-02 NOTE — Progress Notes (Signed)
Victoria Price weight continues to decline and was documented as 152.6 pounds today, which is decreased from 176 pounds March 15th.  Patient reports she tolerates 1-2 oral nutrition supplements daily, typically the juice-based variety that have only 200 calories in them.  She eats very small amounts throughout the day.  She continues to suffer from abdominal pain when eating along with taste alterations.  NUTRITION DIAGNOSIS:  Unintended weight loss continues.  The patient meets criteria for severe protein-calorie malnutrition in the context of chronic illness secondary to a greater than 7.5% weight loss in 3 months and less than 75% of estimated energy requirements for greater than 1 month.    INTERVENTION:  I have educated the patient on strategies for dealing with taste alterations and enforced the importance of continuing to increase oral nutrition supplements to goal of 3 times a day.  We have discussed ways to make them more tolerable.  She was encouraged to try some other protein sources that she has not tried for awhile to see if these are better tolerated.  MONITORING, EVALUATION AND GOALS:  The patient is unable to increase oral intake to minimize weight loss.  She will work to increase oral nutrition supplements to t.i.d.  NEXT VISIT:  Thursday July 11th, during chemotherapy.    ______________________________ Zenovia Jarred, RD, LDN Clinical Nutrition Specialist BN/MEDQ  D:  06/02/2012  T:  06/02/2012  Job:  [id number]

## 2012-06-02 NOTE — Telephone Encounter (Signed)
Per staff message I have scheudled appts.  JMW  

## 2012-06-02 NOTE — Patient Instructions (Addendum)
[facility] Discharge Instructions for Patients Receiving Chemotherapy  Today you received the following chemotherapy agents Oxaliplatin, Leucovorin, Fluorouracil  To help prevent nausea and vomiting after your treatment, we encourage you to take your nausea medication Begin taking it at 7 pm and take it as often as prescribed for the next 24 to 72 hours.   If you develop nausea and vomiting that is not controlled by your nausea medication, call the clinic. If it is after clinic hours your family physician or the after hours number for the clinic or go to the Emergency Department.   BELOW ARE SYMPTOMS THAT SHOULD BE REPORTED IMMEDIATELY:  *FEVER GREATER THAN 100.5 F  *CHILLS WITH OR WITHOUT FEVER  NAUSEA AND VOMITING THAT IS NOT CONTROLLED WITH YOUR NAUSEA MEDICATION  *UNUSUAL SHORTNESS OF BREATH  *UNUSUAL BRUISING OR BLEEDING  TENDERNESS IN MOUTH AND THROAT WITH OR WITHOUT PRESENCE OF ULCERS  *URINARY PROBLEMS  *BOWEL PROBLEMS  UNUSUAL RASH Items with * indicate a potential emergency and should be followed up as soon as possible.  One of the nurses will contact you 24 hours after your treatment. Please let the nurse know about any problems that you may have experienced. Feel free to call the clinic you have any questions or concerns. The clinic phone number is [phone].   I have been informed and understand all the instructions given to me. I know to contact the clinic, my physician, or go to the Emergency Department if any problems should occur. I do not have any questions at this time, but understand that I may call the clinic during office hours   should I have any questions or need assistance in obtaining follow up care.    __________________________________________  _____________  __________ Signature of Patient or Authorized Representative            Date                   Time    __________________________________________ Nurse's  Signature    

## 2012-06-02 NOTE — Telephone Encounter (Signed)
Gv pt appt for [id number].  scheduled appt with Dr. Dayton Scrape for 07/09 @ 2:30pm. sent email to [facility] requesting chemo tx to be added for 07/11. pt is aware that it will be added after md appt on 07/11

## 2012-06-04 ENCOUNTER — Ambulatory Visit (HOSPITAL_BASED_OUTPATIENT_CLINIC_OR_DEPARTMENT_OTHER): Payer: BC Managed Care – PPO

## 2012-06-04 DIAGNOSIS — C3490 Malignant neoplasm of unspecified part of unspecified bronchus or lung: Secondary | ICD-10-CM

## 2012-06-04 DIAGNOSIS — C251 Malignant neoplasm of body of pancreas: Secondary | ICD-10-CM

## 2012-06-06 ENCOUNTER — Other Ambulatory Visit: Payer: Self-pay | Admitting: *Deleted

## 2012-06-06 DIAGNOSIS — C259 Malignant neoplasm of pancreas, unspecified: Secondary | ICD-10-CM

## 2012-06-06 MED ORDER — OXYCODONE HCL 5 MG PO TABS: ORAL_TABLET | ORAL | Status: DC

## 2012-06-06 NOTE — Telephone Encounter (Signed)
Patient called to request refill on her oxycodone 5 mg tablets. Can have husband pick up script this afternoon.

## 2012-06-07 NOTE — Progress Notes (Signed)
Refill request rec'd from [facility] [address] for pt's hydrocodone-acetaminophen 5-500, last filled 02/24/12.  Per Dr. Truett Perna, do not refill, as pt just received oxycodone 5 mg IR prescription 06/06/12, and pt should not be taking both.  This information faxed back to pharmacy at [phone].

## 2012-06-10 ENCOUNTER — Other Ambulatory Visit: Payer: Self-pay | Admitting: *Deleted

## 2012-06-10 DIAGNOSIS — C3490 Malignant neoplasm of unspecified part of unspecified bronchus or lung: Secondary | ICD-10-CM

## 2012-06-10 MED ORDER — ALPRAZOLAM 0.5 MG PO TABS: ORAL_TABLET | ORAL | Status: DC

## 2012-06-17 ENCOUNTER — Encounter: Payer: Self-pay | Admitting: Radiation Oncology

## 2012-06-21 ENCOUNTER — Encounter: Payer: Self-pay | Admitting: Radiation Oncology

## 2012-06-21 ENCOUNTER — Ambulatory Visit
Admission: RE | Admit: 2012-06-21 | Discharge: 2012-06-21 | Disposition: A | Discharge status: Home / Self Care | Payer: BC Managed Care – PPO | Source: Ambulatory Visit | Attending: Radiation Oncology | Admitting: Radiation Oncology

## 2012-06-21 VITALS — BP 123/75 | HR 62 | Temp 98.0°F | Resp 20 | Ht 61.0 in | Wt 152.0 lb

## 2012-06-21 DIAGNOSIS — C3490 Malignant neoplasm of unspecified part of unspecified bronchus or lung: Secondary | ICD-10-CM

## 2012-06-21 DIAGNOSIS — C259 Malignant neoplasm of pancreas, unspecified: Secondary | ICD-10-CM | POA: Insufficient documentation

## 2012-06-21 DIAGNOSIS — Z510 Encounter for antineoplastic radiation therapy: Secondary | ICD-10-CM | POA: Insufficient documentation

## 2012-06-21 NOTE — Progress Notes (Signed)
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    HISTORY OF PRESENT ILLNESS:  Victoria Price is a 56 y.o. female who is seen today for the courtesy of Dr. 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 She presented with left-sided abdominal pain in December 2012. She is found to have a 5 cm pancreatic body mass. She underwent endoscopic ultrasonography by Dr. 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 As part of Victoria Price staging workup she had a PET scan on 02/12/2012 is showing a large pancreatic mass along with a small hepatic capsular lesion, which was hypermetabolic and felt to be suspicious for a peritoneal metastasis. Victoria Price also then have a large left lower lobe mass, markedly hypermetabolic were suggestive of a bronchitic carcinoma rather than metastatic pancreatic cancer. Bronchoscopy by Dr. Vassie Price on 02/24/2012 was diagnostic for small cell carcinoma. She will onto receive FOLFOX chemotherapy for her pancreatic cancer and has thus far received approximately 6 cycles. Her last CT scan of the abdomen show slight interval decrease in size of her pancreatic mass. Victoria Price scans of the chest again show a large left perihilar/mediastinal mass with no new evidence of metastatic disease within the thorax. To my observation there appears to be in some interval growth when compared to her CT scan from 03/04/2012. She still has left abdominal discomfort related to her pancreatic primary. She is the most part  stable from a respiratory standpoint although she's had more wheezing over the past 2 months which she has attributed to her asthma. She reports having more cough over the past 2 days, productive of clear phlegm. She remains fatigued. She has lost approximately 50 pounds over the past 6 months but only 2 pounds over the past 3 weeks. Her appetite varies. She takes OxyContin 10 mg twice a day for pain.  PREVIOUS RADIATION THERAPY: No   PAST MEDICAL HISTORY:  has a past medical history of Asthma; Allergic rhinitis; Pneumonia; Bronchitis; Thyroid disease; Anemia; H/O hiatal hernia; Cancer; Nausea; Anxiety; Small cell lung cancer (02/24/2012); and Pancreatic cancer (01/29/12).     PAST SURGICAL HISTORY:  Past Surgical History  Procedure Date  . Abdominal hysterectomy 2008  . Hernia repair 2007  . Tubal ligation 1998  . Tonsillectomy   . Cesarean section 1981  . Appendectomy   . Eus 01/29/2012    Procedure: UPPER ENDOSCOPIC ULTRASOUND (EUS) LINEAR;  Surgeon: Victoria Bunting, MD;  Location: [facility];  Service: Endoscopy;  Laterality: N/A;  Pt. doesn't know she has CA.... Do not call today (2/12) - office is calling tomorrow.  . Wisdom tooth extraction      FAMILY HISTORY: family history includes Cancer in her father, maternal aunt, maternal uncles, and sister; Lung cancer in her maternal aunt and sister; Prostate cancer in her maternal uncle; and Skin cancer in her father.   SOCIAL HISTORY:  reports that she quit smoking about 5 years ago. Her smoking use included Cigarettes. She has  a 45 pack-year smoking history. She has never used smokeless tobacco. She reports that she drinks about .5 ounces of alcohol per week. She reports that she does not use illicit drugs. Married with one son age 89. She worked as an Set designer.   ALLERGIES: Lactase; Peanut-containing drug products; Corn-containing products; Iodinated diagnostic agents; Wheat; Chicken allergy; Cinnamon; Citrus;  Eggs or egg-derived products; and Strawberry   MEDICATIONS:  Current Outpatient Prescriptions  Medication Sig Dispense Refill  . ALPRAZolam (XANAX) 0.5 MG tablet Take half to one PO Q 8 hours as needed for anxiety or sleep.  90 tablet  0  . Chlorpheniramine-Phenylephrine 3.5-10 MG TABS Take 1 tablet by mouth daily as needed. For allergies.      [name] dexamethasone (DECADRON) 4 MG tablet Take 2 tablets (8 mg total) by mouth 2 (two) times daily with a meal. Take 8 mg twice daily for 2 days beginning day after each chemo  16 tablet  1  . EPINEPHrine (EPIPEN) 0.3 mg/0.3 mL DEVI Inject 0.3 mg into the muscle once. For anaphylaxis.      . Estradiol Acetate 0.05 MG/24HR RING Place 0.05 each vaginally every 3 (three) months. Last insert 02/08/2012.      . fish oil-omega-3 fatty acids 1000 MG capsule Take 1 g by mouth daily.       . fluconazole (DIFLUCAN) 100 MG tablet Take 1 tablet (100 mg total) by mouth daily. X 5 days  5 tablet  0  . fluticasone (FLONASE) 50 MCG/ACT nasal spray Place 2 sprays into the nose daily as needed. For congestion.      [name] levothyroxine (SYNTHROID, LEVOTHROID) 88 MCG tablet Take 44 mcg by mouth daily before breakfast.       . lidocaine-prilocaine (EMLA) cream Apply topically as needed. To port-a-cath one hour prior to chemo.  30 g  0  . LORazepam (ATIVAN) 1 MG tablet Take 1 tablet (1 mg total) by mouth every 6 (six) hours as needed (nausea). Do not take Xanax when on Ativan  60 tablet  0  . oxyCODONE (OXY IR/ROXICODONE) 5 MG immediate release tablet Take one to two tablets PO every 4 hours as needed for pain.  75 tablet  0  . oxyCODONE (OXYCONTIN) 10 MG 12 hr tablet Take 1 tablet (10 mg total) by mouth every 12 (twelve) hours.  60 tablet  0  . Polyethyl Glycol-Propyl Glycol (SYSTANE ULTRA OP) Place 1 drop into both eyes 2 (two) times daily.       . polyethylene glycol (MIRALAX / GLYCOLAX) packet Take 17 g by mouth daily.      . prochlorperazine (COMPAZINE) 10 MG tablet Take 10 mg by  mouth every 6 (six) hours as needed.      [name] DISCONTD: testosterone cypionate (DEPOTESTOTERONE CYPIONATE) 200 MG/ML injection Inject into the muscle.       No current facility-administered medications for this encounter.   Facility-Administered Medications Ordered in Other Encounters  Medication Dose Route Frequency Provider Last Rate Last Dose  . fluorouracil (ADRUCIL) 4,400 mg in sodium chloride 0.9 % 150 mL chemo infusion  2,400 mg/m2 (Treatment Plan Actual) Intravenous 1 day or 1 dose Victoria Artist, MD   4,400 mg at 04/28/12 1535  . sodium chloride 0.9 % injection 10 mL  10 mL Intracatheter PRN Victoria Artist, MD         REVIEW OF SYSTEMS:  Pertinent items are noted in HPI.    PHYSICAL EXAM:  height is 5'  1" (1.549 m) and weight is 152 lb (68.947 kg). Her oral temperature is 98 F (36.7 C). Her blood pressure is 123/75 and her pulse is 62. Her respiration is 20 and oxygen saturation is 98%.   Head and neck examination: Grossly unremarkable. Nodes: Without palpable cervical or supraclavicular lymphadenopathy. Chest:  Right anterior Port-A-Cath. Lungs are clear except for wheezes appreciated along the left mid to upper lung zone, best heard posteriorly. Abdomen: Without masses organomegaly. Extremities: Without edema. Neurologic examination: Grossly nonfocal.   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 I feel we can enhance her response to her left chest small cell carcinoma with a two-week course of palliative radiation. We  would obviously like to avoid left lung atelectasis. I discussed the potential acute and late toxicities of radiation therapy and she wishes to proceed as outlined. We'll move ahead with simulation/treatment planning today and begin her radiation therapy next week. Consent was signed today. I anticipate delivering 2000 cGy in 10 sessions. This can be repeated in the future if clinically indicated. She should be able to continue with chemotherapy during her chest irradiation.   PLAN: As discussed above.   I spent 60 minutes minutes face to face with the patient and more than 50% of that time was spent in counseling and/or coordination of care.

## 2012-06-21 NOTE — Progress Notes (Addendum)
Pt has had 50 lb wgt loss since Feb 2013, denies loss of appetite but states she "cannot eat many foods, has allergies and meats do not digest well". Pt sees dietician regularly.  Pt states she has left-sided constant pain, 4/10 today which decreased s/p chemo tx but has recently increased slightly. Pt takes Oxycontin 10 mg for pain. Denies sob, chronic cough, but states she feels she has recently had sinus/allergy issues with "wheezing, occass cough".

## 2012-06-21 NOTE — Progress Notes (Signed)
Simulation/treatment planning note:  The patient was taken to the CT simulator. She was placed supine with her arms extended on a wing board. Her chest was scanned. I placed an isocenter along her left mediastinum. I contoured her GTV (GTV 2000) and also her CTV (CTV 2000). I expanded her CTV by 0.8 cm to create PTV 2000. I prescribing 2000 cGy in 10 sessions to her PTV 2000. I requesting 3-D simulation/planning with dose volume histograms to include her heart, lungs, and spinal cord since she may receive further radiation therapy. She will also receive concomitant chemotherapy for her pancreas cancer and this will increase her risk for radiation related toxicity, and in particular radiation esophagitis.

## 2012-06-21 NOTE — Progress Notes (Signed)
Please see the Nurse Progress Note in the MD Initial Consult Encounter for this patient. 

## 2012-06-22 ENCOUNTER — Other Ambulatory Visit: Payer: Self-pay | Admitting: Oncology

## 2012-06-22 ENCOUNTER — Telehealth: Payer: Self-pay | Admitting: *Deleted

## 2012-06-22 NOTE — Telephone Encounter (Signed)
Call from pt reporting increased abdominal pain. Requesting a new rx for Oxycontin 20 mg tabs. Has needed to take her breakthrough pain med every 4 hours around the clock. She reports she has increased her beef/ protein intake to try to bring her blood counts up. Teaching reinforced re: chemotherapy induced neutropenia/ thrombocytopenia.  Reviewed with Dr. Truett Perna: Pt instructed to take #2 Oxycontin 10 mg tabs every 12 hours. MD will address at office visit 06/23/12. She voiced understanding and repeated instructions to RN.

## 2012-06-22 NOTE — Progress Notes (Signed)
Met with patient to discuss RO billing.  Dx: 162.9 Lung, NOS  Attending Rad: Dr. Chinita Greenland Tx: [id number] Extrl Beam   Pt has been APPROVED for EPP 80%

## 2012-06-22 NOTE — Addendum Note (Signed)
Encounter addended by: Laruth Hanger Clare Tomi Grandpre, RN on: 06/22/2012  1:24 PM<BR>     Documentation filed: Charges VN

## 2012-06-23 ENCOUNTER — Other Ambulatory Visit (HOSPITAL_BASED_OUTPATIENT_CLINIC_OR_DEPARTMENT_OTHER): Payer: BC Managed Care – PPO | Admitting: Lab

## 2012-06-23 ENCOUNTER — Telehealth: Payer: Self-pay | Admitting: Oncology

## 2012-06-23 ENCOUNTER — Ambulatory Visit: Payer: BC Managed Care – PPO | Admitting: Nutrition

## 2012-06-23 ENCOUNTER — Ambulatory Visit (HOSPITAL_BASED_OUTPATIENT_CLINIC_OR_DEPARTMENT_OTHER): Payer: BC Managed Care – PPO

## 2012-06-23 ENCOUNTER — Telehealth: Payer: Self-pay | Admitting: *Deleted

## 2012-06-23 ENCOUNTER — Ambulatory Visit (HOSPITAL_BASED_OUTPATIENT_CLINIC_OR_DEPARTMENT_OTHER): Payer: BC Managed Care – PPO | Admitting: Oncology

## 2012-06-23 VITALS — BP 99/59 | HR 73 | Temp 98.1°F | Ht 61.0 in | Wt 150.6 lb

## 2012-06-23 DIAGNOSIS — C251 Malignant neoplasm of body of pancreas: Secondary | ICD-10-CM

## 2012-06-23 DIAGNOSIS — K5900 Constipation, unspecified: Secondary | ICD-10-CM

## 2012-06-23 DIAGNOSIS — Z5111 Encounter for antineoplastic chemotherapy: Secondary | ICD-10-CM

## 2012-06-23 DIAGNOSIS — C3490 Malignant neoplasm of unspecified part of unspecified bronchus or lung: Secondary | ICD-10-CM

## 2012-06-23 DIAGNOSIS — C259 Malignant neoplasm of pancreas, unspecified: Secondary | ICD-10-CM

## 2012-06-23 DIAGNOSIS — C7800 Secondary malignant neoplasm of unspecified lung: Secondary | ICD-10-CM

## 2012-06-23 DIAGNOSIS — C250 Malignant neoplasm of head of pancreas: Secondary | ICD-10-CM

## 2012-06-23 LAB — CBC WITH DIFFERENTIAL/PLATELET
Basophils Absolute: 0 10*3/uL (ref 0.0–0.1)
EOS%: 3.7 % (ref 0.0–7.0)
Eosinophils Absolute: 0.1 10*3/uL (ref 0.0–0.5)
HCT: 35.1 % (ref 34.8–46.6)
HGB: 11.8 g/dL (ref 11.6–15.9)
MCH: 31.2 pg (ref 25.1–34.0)
MCV: 92.9 fL (ref 79.5–101.0)
NEUT#: 1.2 10*3/uL — ABNORMAL LOW (ref 1.5–6.5)
NEUT%: 35.9 % — ABNORMAL LOW (ref 38.4–76.8)
RDW: 17.3 % — ABNORMAL HIGH (ref 11.2–14.5)
lymph#: 1.2 10*3/uL (ref 0.9–3.3)

## 2012-06-23 LAB — COMPREHENSIVE METABOLIC PANEL
Albumin: 3.4 g/dL — ABNORMAL LOW (ref 3.5–5.2)
Alkaline Phosphatase: 241 U/L — ABNORMAL HIGH (ref 39–117)
BUN: 5 mg/dL — ABNORMAL LOW (ref 6–23)
CO2: 25 mEq/L (ref 19–32)
Calcium: 9.4 mg/dL (ref 8.4–10.5)
Glucose, Bld: 143 mg/dL — ABNORMAL HIGH (ref 70–99)
Potassium: 3.8 mEq/L (ref 3.5–5.3)
Sodium: 136 mEq/L (ref 135–145)
Total Protein: 6.7 g/dL (ref 6.0–8.3)

## 2012-06-23 MED ORDER — DEXTROSE 5 % IV SOLN
400.0000 mg/m2 | Freq: Once | INTRAVENOUS | Status: AC
  Administered 2012-06-23: 732 mg via INTRAVENOUS
  Filled 2012-06-23: qty 36.6

## 2012-06-23 MED ORDER — DEXTROSE 5 % IV SOLN
Freq: Once | INTRAVENOUS | Status: AC
  Administered 2012-06-23: 10:00:00 via INTRAVENOUS

## 2012-06-23 MED ORDER — OXALIPLATIN CHEMO INJECTION 100 MG/20ML
45.0000 mg/m2 | Freq: Once | INTRAVENOUS | Status: AC
  Administered 2012-06-23: 80 mg via INTRAVENOUS
  Filled 2012-06-23: qty 16

## 2012-06-23 MED ORDER — SODIUM CHLORIDE 0.9 % IV SOLN
2400.0000 mg/m2 | INTRAVENOUS | Status: DC
  Administered 2012-06-23: 4400 mg via INTRAVENOUS
  Filled 2012-06-23: qty 88

## 2012-06-23 MED ORDER — PALONOSETRON HCL INJECTION 0.25 MG/5ML
0.2500 mg | Freq: Once | INTRAVENOUS | Status: AC
  Administered 2012-06-23: 0.25 mg via INTRAVENOUS

## 2012-06-23 MED ORDER — OXYCODONE HCL 20 MG PO TB12: 20.0000 mg | ORAL_TABLET | Freq: Two times a day (BID) | ORAL | Status: DC

## 2012-06-23 MED ORDER — FLUOROURACIL CHEMO INJECTION 2.5 GM/50ML
400.0000 mg/m2 | Freq: Once | INTRAVENOUS | Status: AC
  Administered 2012-06-23: 750 mg via INTRAVENOUS
  Filled 2012-06-23: qty 15

## 2012-06-23 MED ORDER — DEXAMETHASONE SODIUM PHOSPHATE 10 MG/ML IJ SOLN
10.0000 mg | Freq: Once | INTRAMUSCULAR | Status: AC
  Administered 2012-06-23: 10 mg via INTRAVENOUS

## 2012-06-23 NOTE — Progress Notes (Signed)
Ms. Rys continues to lose weight.  It was documented as 150.6 pounds today, July 11th which has decreased from about 152 pounds June 20th. She continues to struggle with a combination of her food allergies and intolerances.  She drinks a 200 Calorie Ensure Clear supplement with no trouble.  However, has been trying to include some different supplements to provide additional calories and protein.  She had a 4 day period over the 4th of July where she ate hot dogs, hamburger and pizza, and then felt really bad after that.  She is voicing concerns about pending radiation therapy to her lung and how this may effect swallowing.  NUTRITION DIAGNOSIS:  Unintended weight loss continues.  INTERVENTION:  I have educated the patient on the importance of continuing tolerated proteins and high-calorie foods to promote weight maintenance.  I have provided her with some lactose and gluten free supplements for her to work in her diet if tolerated.  I have cautioned her against high fat protein sources and foods she knows she does not tolerate.  I have provided her samples to take with her today, and I have done some brief education on strategies for managing nutrition side effects from radiation therapy.  MONITORING/EVALUATION/GOALS:  The patient continues to work to increase her oral intake to minimize weight loss.  NEXT VISIT:  Thursday, July, 25,during chemotherapy.    ______________________________ Zenovia Jarred, RD, LDN Clinical Nutrition Specialist BN/MEDQ  D:  06/23/2012  T:  06/23/2012  Job:  [id number]

## 2012-06-23 NOTE — Progress Notes (Signed)
   [facility]    OFFICE PROGRESS NOTE   INTERVAL HISTORY:   She returns as scheduled. She completed another cycle of FOLFOX on 06/02/2012. No nausea following chemotherapy. She had nausea last week in after eating "meat ". She also had increased abdominal pain last weekend and is now taking OxyContin at a dose of 20 mg every 12 hours. She used only 1 oxycodone tablet yesterday for breakthrough pain. No neuropathy symptoms. [name] reports alternating constipation and diarrhea. She has 2-3 loose stools per day when she has diarrhea.  She saw Dr. Dayton Scrape and will begin left chest radiation within the next week.  Objective:  Vital signs in last 24 hours:  Blood pressure 99/59, pulse 73, temperature 98.1 F (36.7 C), temperature source Oral, height 5\' 1"  (1.549 m), weight 150 lb 9.6 oz (68.312 kg).    HEENT: No thrush or ulcers Resp: Inspiratory wheeze at the left upper posterior and anterior chest, no respiratory distress Cardio: Regular rate and rhythm GI: No hepatomegaly, no mass, mild diffuse tenderness Vascular: No leg edema Neuro: Mild decrease in vibratory sense at the fingertip bilaterally    Portacath/PICC-without erythema  Lab Results:  Lab Results  Component Value Date   WBC 3.2* 06/23/2012   HGB 11.8 06/23/2012   HCT 35.1 06/23/2012   MCV 92.9 06/23/2012   PLT 91* 06/23/2012   ANC 1.2    Medications: I have reviewed the patient's current medications.  Assessment/Plan: 1. Locally advanced pancreas cancer, adenosquamous carcinoma, presenting with a pancreas body mass and CT/ultrasound evidence of portal vein/SMV invasion. She completed cycle 1 of FOLFOX 03/10/2012. She completed cycle 5 on 05/19/2012. CA 19-9 was improved on 05/19/2012. Restaging CT scan 05/31/2012 showed a decrease in the size of the pancreatic mass. 2. Delayed nausea following cycle 1 of FOLFOX. She receives Aloxi premedication. She takes prophylactic dexamethasone 8 mg twice daily  for 2 days beginning day 2. No nausea following the most recent cycle of FOLFOX 3. Pain secondary to the locally advanced pancreas mass. The pain overall is improved. She has recently noted a mild increase in the pain. She continues OxyContin with oxycodone as needed. 4. Small cell lung cancer. Left perihilar/lung mass was stable on chest x-ray 04/05/2012 and on CT 05/31/2012. She is scheduled to begin radiation to the left chest mass on 06/28/2012. 5. Staging PET scan 02/12/2012 confirming a hypermetabolic left lung mass, hypermetabolic pancreas mass, hypermetabolic peritoneal implant. Status post bronchoscopy confirming a left endobronchial mass and left hilar mass on 02/24/2012. Biopsy of both areas confirmed small cell carcinoma. 6. Baseline nausea. Question related to tumor abutting the stomach. Improved. 7. Constipation. She continues daily MiraLAX. Neutropenia/thrombocytopenia secondary to chemotherapy. She completed chemotherapy with the counts at a similar level on 06/02/2012. She knows to contact us for a fever, symptoms of infection, or bleeding.       8.    ? 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 Her performance status remains improved as compared to 3 chemotherapy. The plan is to proceed with cycle 7 of FOLFOX today. She will return for an office visit and chemotherapy in 2 weeks. We will check a CBC on 07/01/2012.   Thornton Papas, MD  06/23/2012  9:24 AM

## 2012-06-23 NOTE — Patient Instructions (Addendum)
[facility] Discharge Instructions for Patients Receiving Chemotherapy  Today you received the following chemotherapy agents Oxaliplatin, Leucovorin and Adrucil.  To help prevent nausea and vomiting after your treatment, we encourage you to take your nausea medication. Begin taking it as often as prescribed for by Dr. Sherrill.    If you develop nausea and vomiting that is not controlled by your nausea medication, call the clinic. If it is after clinic hours your family physician or the after hours number for the clinic or go to the Emergency Department.   BELOW ARE SYMPTOMS THAT SHOULD BE REPORTED IMMEDIATELY:  *FEVER GREATER THAN 100.5 F  *CHILLS WITH OR WITHOUT FEVER  NAUSEA AND VOMITING THAT IS NOT CONTROLLED WITH YOUR NAUSEA MEDICATION  *UNUSUAL SHORTNESS OF BREATH  *UNUSUAL BRUISING OR BLEEDING  TENDERNESS IN MOUTH AND THROAT WITH OR WITHOUT PRESENCE OF ULCERS  *URINARY PROBLEMS  *BOWEL PROBLEMS  UNUSUAL RASH Items with * indicate a potential emergency and should be followed up as soon as possible.  One of the nurses will contact you 24 hours after your treatment. Please let the nurse know about any problems that you may have experienced. Feel free to call the clinic you have any questions or concerns. The clinic phone number is [phone].   I have been informed and understand all the instructions given to me. I know to contact the clinic, my physician, or go to the Emergency Department if any problems should occur. I do not have any questions at this time, but understand that I may call the clinic during office hours   should I have any questions or need assistance in obtaining follow up care.    __________________________________________  _____________  __________ Signature of Patient or Authorized Representative            Date                   Time    __________________________________________ Nurse's Signature    

## 2012-06-23 NOTE — Telephone Encounter (Signed)
appts made and mw to add tx and print for pt,pt aware °

## 2012-06-23 NOTE — Progress Notes (Signed)
0930 Ok to treat with current lab levels per Dr. Truett Perna.

## 2012-06-23 NOTE — Telephone Encounter (Signed)
gve the pt her July 2013 appt calendar °

## 2012-06-23 NOTE — Telephone Encounter (Signed)
Per staff message I have scheduled appt. JMW 

## 2012-06-25 ENCOUNTER — Ambulatory Visit (HOSPITAL_BASED_OUTPATIENT_CLINIC_OR_DEPARTMENT_OTHER): Payer: BC Managed Care – PPO

## 2012-06-25 VITALS — BP 123/73 | HR 69 | Temp 98.3°F

## 2012-06-25 DIAGNOSIS — C259 Malignant neoplasm of pancreas, unspecified: Secondary | ICD-10-CM

## 2012-06-25 DIAGNOSIS — C7800 Secondary malignant neoplasm of unspecified lung: Secondary | ICD-10-CM

## 2012-06-25 DIAGNOSIS — Z452 Encounter for adjustment and management of vascular access device: Secondary | ICD-10-CM

## 2012-06-25 DIAGNOSIS — C251 Malignant neoplasm of body of pancreas: Secondary | ICD-10-CM

## 2012-06-25 MED ORDER — HEPARIN SOD (PORK) LOCK FLUSH 100 UNIT/ML IV SOLN
500.0000 [IU] | Freq: Once | INTRAVENOUS | Status: AC | PRN
  Administered 2012-06-25: 500 [IU]
  Filled 2012-06-25: qty 5

## 2012-06-25 MED ORDER — SODIUM CHLORIDE 0.9 % IJ SOLN
10.0000 mL | INTRAMUSCULAR | Status: DC | PRN
  Administered 2012-06-25: 10 mL
  Filled 2012-06-25: qty 10

## 2012-06-28 ENCOUNTER — Ambulatory Visit: Payer: BC Managed Care – PPO

## 2012-06-29 ENCOUNTER — Ambulatory Visit: Payer: BC Managed Care – PPO

## 2012-06-30 ENCOUNTER — Ambulatory Visit
Admission: RE | Admit: 2012-06-30 | Discharge: 2012-06-30 | Disposition: A | Discharge status: Home / Self Care | Payer: BC Managed Care – PPO | Source: Ambulatory Visit | Attending: Radiation Oncology | Admitting: Radiation Oncology

## 2012-06-30 DIAGNOSIS — C3490 Malignant neoplasm of unspecified part of unspecified bronchus or lung: Secondary | ICD-10-CM

## 2012-06-30 NOTE — Progress Notes (Signed)
Post sim ed completed w/pt; gave pt "Radiation and You" booklet w/pertinent pages marked and discussed. Pt took notes on paper. All questions answered.  Pt verbalized understanding, correct teachback.

## 2012-07-01 ENCOUNTER — Ambulatory Visit
Admission: RE | Admit: 2012-07-01 | Discharge: 2012-07-01 | Disposition: A | Discharge status: Home / Self Care | Payer: BC Managed Care – PPO | Source: Ambulatory Visit | Attending: Radiation Oncology | Admitting: Radiation Oncology

## 2012-07-01 ENCOUNTER — Other Ambulatory Visit (HOSPITAL_BASED_OUTPATIENT_CLINIC_OR_DEPARTMENT_OTHER): Payer: BC Managed Care – PPO | Admitting: Lab

## 2012-07-01 DIAGNOSIS — C259 Malignant neoplasm of pancreas, unspecified: Secondary | ICD-10-CM

## 2012-07-01 LAB — CBC WITH DIFFERENTIAL/PLATELET
BASO%: 0.5 % (ref 0.0–2.0)
Eosinophils Absolute: 0.1 10*3/uL (ref 0.0–0.5)
MCHC: 32.7 g/dL (ref 31.5–36.0)
MONO#: 0.4 10*3/uL (ref 0.1–0.9)
NEUT#: 2.1 10*3/uL (ref 1.5–6.5)
RBC: 3.6 10*6/uL — ABNORMAL LOW (ref 3.70–5.45)
RDW: 16.6 % — ABNORMAL HIGH (ref 11.2–14.5)
WBC: 3.7 10*3/uL — ABNORMAL LOW (ref 3.9–10.3)
lymph#: 1 10*3/uL (ref 0.9–3.3)

## 2012-07-02 ENCOUNTER — Encounter: Payer: Self-pay | Admitting: Radiation Oncology

## 2012-07-02 NOTE — Progress Notes (Signed)
Simulation verification note: The patient underwent simulation verification on 06/30/2012 for treatment to her right chest in the management of her small cell carcinoma of the right lung. Her isocenter was in good position and the multileaf collimators contoured the treatment volume appropriately.

## 2012-07-04 ENCOUNTER — Ambulatory Visit
Admission: RE | Admit: 2012-07-04 | Discharge: 2012-07-04 | Disposition: A | Discharge status: Home / Self Care | Payer: BC Managed Care – PPO | Source: Ambulatory Visit | Attending: Radiation Oncology | Admitting: Radiation Oncology

## 2012-07-04 VITALS — BP 115/72 | HR 70 | Temp 97.8°F | Wt 150.8 lb

## 2012-07-04 DIAGNOSIS — C3490 Malignant neoplasm of unspecified part of unspecified bronchus or lung: Secondary | ICD-10-CM

## 2012-07-04 NOTE — Progress Notes (Signed)
Weekly Management Note:  Site:L Lung Current Dose:  600  cGy Projected Dose: 2000  cGy  Narrative: The patient is seen today for routine under treatment assessment. CBCT/MVCT images/port films were reviewed. The chart was reviewed.   She states that she still has occasional wheezing when lying down. No new complaints appear  Physical Examination:  Filed Vitals:   07/04/12 1453  BP: 115/72  Pulse: 70  Temp: 97.8 F (36.6 C)  .  Weight: 150 lb 12.8 oz (68.402 kg). Lungs are clear.  Labs:  Lab Results  Component Value Date   WBC 3.7* 07/01/2012   HGB 11.4* 07/01/2012   HCT 35.0 07/01/2012   MCV 97.1 07/01/2012   PLT 81* 07/01/2012    Impression: Tolerating radiation therapy well.  Plan: Continue radiation therapy as planned.

## 2012-07-04 NOTE — Progress Notes (Signed)
Here for routine weekly under treat visit for radiation of lung.Has completed 3 of 10 treatments. Denies shortness of breath but does have occasional wheeze.Productive clear secretions at times. Left abdominal gas pain.

## 2012-07-05 ENCOUNTER — Ambulatory Visit
Admission: RE | Admit: 2012-07-05 | Discharge: 2012-07-05 | Disposition: A | Discharge status: Home / Self Care | Payer: BC Managed Care – PPO | Source: Ambulatory Visit | Attending: Radiation Oncology | Admitting: Radiation Oncology

## 2012-07-06 ENCOUNTER — Other Ambulatory Visit: Payer: Self-pay | Admitting: Oncology

## 2012-07-06 ENCOUNTER — Ambulatory Visit
Admission: RE | Admit: 2012-07-06 | Discharge: 2012-07-06 | Disposition: A | Discharge status: Home / Self Care | Payer: BC Managed Care – PPO | Source: Ambulatory Visit | Attending: Radiation Oncology | Admitting: Radiation Oncology

## 2012-07-07 ENCOUNTER — Ambulatory Visit (HOSPITAL_BASED_OUTPATIENT_CLINIC_OR_DEPARTMENT_OTHER): Payer: BC Managed Care – PPO | Admitting: Oncology

## 2012-07-07 ENCOUNTER — Ambulatory Visit: Payer: BC Managed Care – PPO | Admitting: Nutrition

## 2012-07-07 ENCOUNTER — Other Ambulatory Visit (HOSPITAL_BASED_OUTPATIENT_CLINIC_OR_DEPARTMENT_OTHER): Payer: BC Managed Care – PPO | Admitting: Lab

## 2012-07-07 ENCOUNTER — Ambulatory Visit
Admission: RE | Admit: 2012-07-07 | Discharge: 2012-07-07 | Disposition: A | Discharge status: Home / Self Care | Payer: BC Managed Care – PPO | Source: Ambulatory Visit | Attending: Radiation Oncology | Admitting: Radiation Oncology

## 2012-07-07 ENCOUNTER — Telehealth: Payer: Self-pay | Admitting: *Deleted

## 2012-07-07 ENCOUNTER — Ambulatory Visit (HOSPITAL_BASED_OUTPATIENT_CLINIC_OR_DEPARTMENT_OTHER): Payer: BC Managed Care – PPO

## 2012-07-07 VITALS — BP 104/58 | HR 63 | Temp 97.6°F | Ht 61.0 in | Wt 152.0 lb

## 2012-07-07 DIAGNOSIS — C259 Malignant neoplasm of pancreas, unspecified: Secondary | ICD-10-CM

## 2012-07-07 DIAGNOSIS — Z5111 Encounter for antineoplastic chemotherapy: Secondary | ICD-10-CM

## 2012-07-07 DIAGNOSIS — C251 Malignant neoplasm of body of pancreas: Secondary | ICD-10-CM

## 2012-07-07 DIAGNOSIS — C7800 Secondary malignant neoplasm of unspecified lung: Secondary | ICD-10-CM

## 2012-07-07 DIAGNOSIS — J45909 Unspecified asthma, uncomplicated: Secondary | ICD-10-CM

## 2012-07-07 LAB — COMPREHENSIVE METABOLIC PANEL
ALT: 24 U/L (ref 0–35)
Albumin: 3 g/dL — ABNORMAL LOW (ref 3.5–5.2)
CO2: 27 mEq/L (ref 19–32)
Calcium: 9.1 mg/dL (ref 8.4–10.5)
Chloride: 102 mEq/L (ref 96–112)
Glucose, Bld: 96 mg/dL (ref 70–99)
Potassium: 3.7 mEq/L (ref 3.5–5.3)
Sodium: 136 mEq/L (ref 135–145)
Total Bilirubin: 0.4 mg/dL (ref 0.3–1.2)
Total Protein: 6.1 g/dL (ref 6.0–8.3)

## 2012-07-07 LAB — CBC WITH DIFFERENTIAL/PLATELET
BASO%: 0.3 % (ref 0.0–2.0)
LYMPH%: 32.1 % (ref 14.0–49.7)
MCHC: 33.9 g/dL (ref 31.5–36.0)
MONO#: 0.6 10*3/uL (ref 0.1–0.9)
Platelets: 79 10*3/uL — ABNORMAL LOW (ref 145–400)
RBC: 3.55 10*6/uL — ABNORMAL LOW (ref 3.70–5.45)
RDW: 16.3 % — ABNORMAL HIGH (ref 11.2–14.5)
WBC: 3 10*3/uL — ABNORMAL LOW (ref 3.9–10.3)
nRBC: 0 % (ref 0–0)

## 2012-07-07 LAB — CANCER ANTIGEN 19-9: CA 19-9: 166.1 U/mL — ABNORMAL HIGH (ref <35.0)

## 2012-07-07 MED ORDER — FLUOROURACIL CHEMO INJECTION 2.5 GM/50ML
400.0000 mg/m2 | Freq: Once | INTRAVENOUS | Status: AC
  Administered 2012-07-07: 750 mg via INTRAVENOUS
  Filled 2012-07-07: qty 15

## 2012-07-07 MED ORDER — ONDANSETRON HCL 8 MG PO TABS: 8.0000 mg | ORAL_TABLET | Freq: Two times a day (BID) | ORAL | Status: DC | PRN

## 2012-07-07 MED ORDER — OXALIPLATIN CHEMO INJECTION 100 MG/20ML
45.0000 mg/m2 | Freq: Once | INTRAVENOUS | Status: AC
  Administered 2012-07-07: 80 mg via INTRAVENOUS
  Filled 2012-07-07: qty 16

## 2012-07-07 MED ORDER — PALONOSETRON HCL INJECTION 0.25 MG/5ML
0.2500 mg | Freq: Once | INTRAVENOUS | Status: AC
  Administered 2012-07-07: 0.25 mg via INTRAVENOUS

## 2012-07-07 MED ORDER — LEUCOVORIN CALCIUM INJECTION 350 MG
400.0000 mg/m2 | Freq: Once | INTRAVENOUS | Status: AC
  Administered 2012-07-07: 732 mg via INTRAVENOUS
  Filled 2012-07-07: qty 36.6

## 2012-07-07 MED ORDER — IPRATROPIUM BROMIDE HFA 17 MCG/ACT IN AERS: 2.0000 | INHALATION_SPRAY | Freq: Four times a day (QID) | RESPIRATORY_TRACT | Status: DC | PRN

## 2012-07-07 MED ORDER — DEXAMETHASONE SODIUM PHOSPHATE 10 MG/ML IJ SOLN
10.0000 mg | Freq: Once | INTRAMUSCULAR | Status: AC
  Administered 2012-07-07: 10 mg via INTRAVENOUS

## 2012-07-07 MED ORDER — SODIUM CHLORIDE 0.9 % IV SOLN
2400.0000 mg/m2 | INTRAVENOUS | Status: DC
  Administered 2012-07-07: 4400 mg via INTRAVENOUS
  Filled 2012-07-07: qty 88

## 2012-07-07 MED ORDER — DEXTROSE 5 % IV SOLN
Freq: Once | INTRAVENOUS | Status: AC
  Administered 2012-07-07: 10:00:00 via INTRAVENOUS

## 2012-07-07 NOTE — Progress Notes (Signed)
Ms. Goins wt has now been stable x 3 visits.  Wt 152 pounds Wt on 06/02/12 152.6#.  Pt reports appetite and po intake are good, eating small frequent meals.  Resident has numerous food allergies and intolerances, but has been managing to find some high kcal/high protein foods to eat.  Pt states she consumes 2-3 Ensure clear or Ensures/day.  Pt does not tolerate Ensure Complete, but has been able to drink regular Ensure.  Resident reports one episode of diarrhea, but has meds which help.    Nutrition Diagnosis:  Unintended weight loss continues to improve.    Intervention:  Encouraged the patient to continue to consume high kcal/high protein meals and snacks along with Ensure clear and Ensure as tolerated.  Offered coupons, but patient declined.    Monitoring/evaluation/goasl:  Patient continues to work to consume adequate calories and protein for wt maintenance and good nutrition despite food allergies and intolerances.    Darol Destine, RD, LDN, MPH

## 2012-07-07 NOTE — Progress Notes (Signed)
   [facility]    OFFICE PROGRESS NOTE   INTERVAL HISTORY:   She returns as scheduled. She has completed 5 treatments with chest radiation. She continues to have intermittent "wheezing ". She completed another cycle of FOLFOX on 06/23/2012. No neuropathy symptoms. She continues to have left-sided abdominal pain and intermittent nausea. Compazine has not helped the nausea. Ativan makes her "loopy ".  Objective:  Vital signs in last 24 hours:  Blood pressure 104/58, pulse 63, temperature 97.6 F (36.4 C), temperature source Oral, height 5\' 1"  (1.549 m), weight 152 lb (68.947 kg).    HEENT: No thrush or ulcers Resp: Inspiratory wheeze at the left anterior and posterior chest, good air movement bilaterally, no respiratory distress Cardio: Regular rate and rhythm GI: No hepatomegaly, no mass, nontender Vascular: No leg edema Neuro: Mild decrease in vibratory sense at the fingertips bilaterally  Portacath/PICC-without erythema  Lab Results:  Lab Results  Component Value Date   WBC 3.0* 07/07/2012   HGB 11.3* 07/07/2012   HCT 33.3* 07/07/2012   MCV 93.8 07/07/2012   PLT 79* 07/07/2012   ANC 1.3    Medications: I have reviewed the patient's current medications.  Assessment/Plan: 1. Locally advanced pancreas cancer, adenosquamous carcinoma, presenting with a pancreas body mass and CT/ultrasound evidence of portal vein/SMV invasion. She completed cycle 1 of FOLFOX 03/10/2012. She completed cycle 5 on 05/19/2012. CA 19-9 was improved on 05/19/2012. Restaging CT scan 05/31/2012 showed a decrease in the size of the pancreatic mass. 2. Delayed nausea following cycle 1 of FOLFOX. She receives Aloxi premedication. She takes prophylactic dexamethasone 8 mg twice daily for 2 days beginning day 2. No nausea following the most recent cycle of FOLFOX 3. Pain secondary to the locally advanced pancreas mass. The pain overall is improved. She has recently noted a mild increase in the  pain. She continues OxyContin with oxycodone as needed. 4. Small cell lung cancer. Left perihilar/lung mass was stable on chest x-ray 04/05/2012 and on CT 05/31/2012. She is scheduled to begin radiation to the left chest mass on 06/28/2012. 5. Staging PET scan 02/12/2012 confirming a hypermetabolic left lung mass, hypermetabolic pancreas mass, hypermetabolic peritoneal implant. Status post bronchoscopy confirming a left endobronchial mass and left hilar mass on 02/24/2012. Biopsy of both areas confirmed small cell carcinoma. 6. Baseline nausea. Question related to tumor abutting the stomach. Improved. 7. Constipation. She continues daily MiraLAX. 8.Neutropenia/thrombocytopenia secondary to chemotherapy. She will receive Neulasta with this cycle. 9. ? Early oxaliplatin neuropathy  10. Mild dyspnea/wheezing-likely related to the left lung mass-we prescribed an Atrovent inhaler (she reports tachycardia with an albuterol inhaler)    Disposition:  Her overall performance status is unchanged. She will complete left chest radiation over the next week. The plan is to proceed with cycle 8 of FOLFOX today. The platelets are low and the neutrophil count is mildly low. She will receive Neulasta support with chemotherapy. Ms. Seabolt is scheduled for an office visit on 07/18/2012. She will try Zofran for nausea.   Thornton Papas, MD  07/07/2012  3:21 PM

## 2012-07-07 NOTE — Progress Notes (Signed)
Per MD, ok to tx.

## 2012-07-07 NOTE — Telephone Encounter (Signed)
Sent michelle email to set up patient's 07-21-2012 treatment 07-23-2012 pump d/c

## 2012-07-07 NOTE — Telephone Encounter (Signed)
Per staff message and POF I have scheduled appts.  JMW  

## 2012-07-07 NOTE — Patient Instructions (Addendum)
Pt will call with any problems 

## 2012-07-08 ENCOUNTER — Telehealth: Payer: Self-pay | Admitting: Medical Oncology

## 2012-07-08 ENCOUNTER — Ambulatory Visit
Admission: RE | Admit: 2012-07-08 | Discharge: 2012-07-08 | Disposition: A | Discharge status: Home / Self Care | Payer: BC Managed Care – PPO | Source: Ambulatory Visit | Attending: Radiation Oncology | Admitting: Radiation Oncology

## 2012-07-08 ENCOUNTER — Encounter: Payer: Self-pay | Admitting: Certified Registered Nurse Anesthetist

## 2012-07-08 NOTE — Telephone Encounter (Signed)
Left message to return my call.  

## 2012-07-08 NOTE — Progress Notes (Unsigned)
Home infusion pump alarm.  Pt. Walked in infusion room this morning with alarm/pump issue.  Pt. Stated that pump was alarming air filter last night and had placed a call to [employer] for trouble shooting.  Pt. Stated that it began to alarm again this morning so she came to [place] knowing we are open.   Setting reviewed, pump tubing reloaded  and pump reset to RUN.   Setting reviewed and pt. Had 102..8 ml infused with 135.2 left to be infused. Rate at 5.2.  Calculation rechecked and pt. Is scheduled to be completed around noon on Saturday.  Pt. Aware to call if she has further questions.  HL

## 2012-07-08 NOTE — Telephone Encounter (Signed)
Pt notified that ca19-9 is better. I also told her she will get neulasta injection tomorrow.Side effects discussed with pt.

## 2012-07-09 ENCOUNTER — Ambulatory Visit (HOSPITAL_BASED_OUTPATIENT_CLINIC_OR_DEPARTMENT_OTHER): Payer: BC Managed Care – PPO

## 2012-07-09 VITALS — BP 122/70 | HR 60 | Temp 98.3°F

## 2012-07-09 DIAGNOSIS — Z5189 Encounter for other specified aftercare: Secondary | ICD-10-CM

## 2012-07-09 DIAGNOSIS — C251 Malignant neoplasm of body of pancreas: Secondary | ICD-10-CM

## 2012-07-09 DIAGNOSIS — C259 Malignant neoplasm of pancreas, unspecified: Secondary | ICD-10-CM

## 2012-07-09 MED ORDER — SODIUM CHLORIDE 0.9 % IJ SOLN
10.0000 mL | INTRAMUSCULAR | Status: DC | PRN
  Administered 2012-07-09: 10 mL
  Filled 2012-07-09: qty 10

## 2012-07-09 MED ORDER — HEPARIN SOD (PORK) LOCK FLUSH 100 UNIT/ML IV SOLN
500.0000 [IU] | Freq: Once | INTRAVENOUS | Status: AC | PRN
  Administered 2012-07-09: 500 [IU]
  Filled 2012-07-09: qty 5

## 2012-07-09 MED ORDER — PEGFILGRASTIM INJECTION 6 MG/0.6ML
6.0000 mg | Freq: Once | SUBCUTANEOUS | Status: AC
  Administered 2012-07-09: 6 mg via SUBCUTANEOUS

## 2012-07-11 ENCOUNTER — Ambulatory Visit
Admission: RE | Admit: 2012-07-11 | Discharge: 2012-07-11 | Disposition: A | Discharge status: Home / Self Care | Payer: BC Managed Care – PPO | Source: Ambulatory Visit | Attending: Radiation Oncology | Admitting: Radiation Oncology

## 2012-07-12 ENCOUNTER — Other Ambulatory Visit: Payer: Self-pay | Admitting: *Deleted

## 2012-07-12 ENCOUNTER — Ambulatory Visit
Admission: RE | Admit: 2012-07-12 | Discharge: 2012-07-12 | Disposition: A | Discharge status: Home / Self Care | Payer: BC Managed Care – PPO | Source: Ambulatory Visit | Attending: Radiation Oncology | Admitting: Radiation Oncology

## 2012-07-12 ENCOUNTER — Encounter: Payer: Self-pay | Admitting: Radiation Oncology

## 2012-07-12 VITALS — BP 96/58 | HR 72 | Temp 96.9°F | Resp 20

## 2012-07-12 DIAGNOSIS — C3490 Malignant neoplasm of unspecified part of unspecified bronchus or lung: Secondary | ICD-10-CM

## 2012-07-12 MED ORDER — ALPRAZOLAM 0.5 MG PO TABS: ORAL_TABLET | ORAL | Status: DC

## 2012-07-12 NOTE — Progress Notes (Signed)
Patient alert,oriented x3, sob with exertion, coughs occasionally clear sputum, has had 3 loose stools today,ate bran muffin, zofran prn nausea, took pain med prior to coming here no c/o pain , lying on the table causes pain states patient, 97% room air sats, patient ate bran muffin today, feels like food getting stuuck in thropat , patient ground up her food last night, takes biotene for dry mouth,compleetd 9/10 txs, gave 1 month f/u appt card 3:13 PM

## 2012-07-12 NOTE — Addendum Note (Signed)
Encounter addended by: Maryln Gottron, MD on: 07/12/2012  3:38 PM<BR>     Documentation filed: Notes Section

## 2012-07-12 NOTE — Progress Notes (Addendum)
Weekly Management Note:  Site:L Lung Current Dose:  1800  cGy Projected Dose: 2000  cGy  Narrative: The patient is seen today for routine under treatment assessment. CBCT/MVCT images/port films were reviewed. The chart was reviewed.   She is generally doing well although she does have mild dysphagia. Her cough is much improved. No new respiratory difficulties. O2 sat 97% on room air.  Physical Examination:  Filed Vitals:   07/12/12 1509  BP: 96/58  Pulse: 72  Temp: 96.9 F (36.1 C)  Resp: 20  .  Weight:  . No change. There is slight erythema the skin, particularly her left back. No areas of desquamation.  Laboratory data: Lab Results  Component Value Date   WBC 3.0* 07/07/2012   HGB 11.3* 07/07/2012   HCT 33.3* 07/07/2012   MCV 93.8 07/07/2012   PLT 79* 07/07/2012   CA 19-9 on 07/07/2012 was down to 166.1  Impression: Tolerating radiation therapy well.  Plan: Continue radiation therapy as planned. She will finish her radiation therapy tomorrow and return here for a followup visit one month.

## 2012-07-13 ENCOUNTER — Ambulatory Visit
Admission: RE | Admit: 2012-07-13 | Discharge: 2012-07-13 | Disposition: A | Discharge status: Home / Self Care | Payer: BC Managed Care – PPO | Source: Ambulatory Visit | Attending: Radiation Oncology | Admitting: Radiation Oncology

## 2012-07-14 ENCOUNTER — Encounter: Payer: Self-pay | Admitting: Radiation Oncology

## 2012-07-14 NOTE — Progress Notes (Signed)
[facility] Radiation Oncology End of Treatment Note  Name:Victoria Price  Date: 07/14/2012 ZOX:[id number] DOB:August 07, 1956   Status:outpatient    CC: Juline Patch, MD  Dr. Mancel Bale  REFERRING PHYSICIAN: Dr. Mancel Bale   DIAGNOSIS: Small cell carcinoma of the lung   INDICATION FOR TREATMENT: Palliative   TREATMENT DATES: 05/30/2012 through 07/11/2012                          SITE/DOSE:     Left lung/mediastinum 2000 cGy 10 sessions                       BEAMS/ENERGY:   Three-field technique, 6 MV photons, RAO and LAO and 10 MV photons LPO                NARRATIVE:   She tolerated treatment well with only mild dysphagia by completion of therapy. Her cough is much improved. She'll continue with her chemotherapy, primarily to manage her pancreatic cancer.                         PLAN: Routine followup in one month. Patient instructed to call if questions or worsening complaints in interim.

## 2012-07-14 NOTE — Progress Notes (Signed)
3-D simulation note: The patient underwent 3-D simulation on 06/23/2012. Dose volume histograms were obtained for the spinal cord, target, heart, lungs and esophagus. We met our guidelines. I prescribed a dose of 2000 cGy in 10 fractions utilizing a three-field technique. 3 separate multileaf collimators were designed to conform the field.

## 2012-07-18 ENCOUNTER — Ambulatory Visit (HOSPITAL_BASED_OUTPATIENT_CLINIC_OR_DEPARTMENT_OTHER): Payer: BC Managed Care – PPO | Admitting: Nurse Practitioner

## 2012-07-18 ENCOUNTER — Other Ambulatory Visit (HOSPITAL_BASED_OUTPATIENT_CLINIC_OR_DEPARTMENT_OTHER): Payer: BC Managed Care – PPO

## 2012-07-18 ENCOUNTER — Encounter: Payer: Self-pay | Admitting: Radiation Oncology

## 2012-07-18 ENCOUNTER — Telehealth: Payer: Self-pay | Admitting: Oncology

## 2012-07-18 VITALS — BP 122/73 | HR 59 | Temp 97.1°F | Resp 20 | Ht 61.0 in | Wt 150.6 lb

## 2012-07-18 DIAGNOSIS — D702 Other drug-induced agranulocytosis: Secondary | ICD-10-CM

## 2012-07-18 DIAGNOSIS — C3490 Malignant neoplasm of unspecified part of unspecified bronchus or lung: Secondary | ICD-10-CM

## 2012-07-18 DIAGNOSIS — C251 Malignant neoplasm of body of pancreas: Secondary | ICD-10-CM

## 2012-07-18 DIAGNOSIS — C3430 Malignant neoplasm of lower lobe, unspecified bronchus or lung: Secondary | ICD-10-CM

## 2012-07-18 DIAGNOSIS — R21 Rash and other nonspecific skin eruption: Secondary | ICD-10-CM

## 2012-07-18 DIAGNOSIS — C259 Malignant neoplasm of pancreas, unspecified: Secondary | ICD-10-CM

## 2012-07-18 LAB — CBC WITH DIFFERENTIAL/PLATELET
BASO%: 0.2 % (ref 0.0–2.0)
Basophils Absolute: 0 10*3/uL (ref 0.0–0.1)
EOS%: 1.1 % (ref 0.0–7.0)
HCT: 35.3 % (ref 34.8–46.6)
HGB: 11.6 g/dL (ref 11.6–15.9)
LYMPH%: 12.2 % — ABNORMAL LOW (ref 14.0–49.7)
MCH: 32.6 pg (ref 25.1–34.0)
MCHC: 32.9 g/dL (ref 31.5–36.0)
MCV: 99.1 fL (ref 79.5–101.0)
MONO%: 11.3 % (ref 0.0–14.0)
NEUT%: 75.2 % (ref 38.4–76.8)
Platelets: 57 10*3/uL — ABNORMAL LOW (ref 145–400)

## 2012-07-18 LAB — COMPREHENSIVE METABOLIC PANEL
ALT: 25 U/L (ref 0–35)
AST: 31 U/L (ref 0–37)
Alkaline Phosphatase: 198 U/L — ABNORMAL HIGH (ref 39–117)
BUN: 7 mg/dL (ref 6–23)
Calcium: 9 mg/dL (ref 8.4–10.5)
Chloride: 100 mEq/L (ref 96–112)
Creatinine, Ser: 0.67 mg/dL (ref 0.50–1.10)

## 2012-07-18 NOTE — Telephone Encounter (Signed)
appts made and printed for pt,pt aware that tx will be added (mel D to add tx  )

## 2012-07-18 NOTE — Progress Notes (Signed)
OFFICE PROGRESS NOTE  Interval history: Victoria Price returns as scheduled. She completed cycle 8 FOLFOX 07/07/2012. She completed chest radiation 07/13/2012. She denies nausea/vomiting. No mouth sores. She had loose stools for one day following the chemotherapy. Cold sensitivity lasted less than 5 days. She denies persistent neuropathy symptoms. Since completing radiation she has developed a pruritic rash at the anterior and posterior chest.  Objective: Blood pressure 122/73, pulse 59, temperature 97.1 F (36.2 C), temperature source Oral, resp. rate 20, height 5\' 1"  (1.549 m), weight 150 lb 9.6 oz (68.312 kg).  Oropharynx is without thrush or ulceration. Lungs are clear. Regular cardiac rhythm. Port-A-Cath site is without erythema. Abdomen is soft and nontender. No hepatomegaly. Extremities are without edema. Erythematous macular/papular rash at the anterior/posterior chest.  Lab Results: Lab Results  Component Value Date   WBC 6.3 07/18/2012   HGB 11.6 07/18/2012   HCT 35.3 07/18/2012   MCV 99.1 07/18/2012   PLT 57* 07/18/2012    Chemistry:    Chemistry      Component Value Date/Time   NA 136 07/07/2012 0756   K 3.7 07/07/2012 0756   CL 102 07/07/2012 0756   CO2 27 07/07/2012 0756   BUN 5* 07/07/2012 0756   CREATININE 0.52 07/07/2012 0756      Component Value Date/Time   CALCIUM 9.1 07/07/2012 0756   ALKPHOS 202* 07/07/2012 0756   AST 28 07/07/2012 0756   ALT 24 07/07/2012 0756   BILITOT 0.4 07/07/2012 0756       Studies/Results: No results found.  Medications: I have reviewed the patient's current medications.  Assessment/Plan:  1. Locally advanced pancreas cancer, adenosquamous carcinoma, presenting with a pancreas body mass and CT/ultrasound evidence of portal vein/SMV invasion. She completed cycle 1 of FOLFOX 03/10/2012. She completed cycle 5 on 05/19/2012. CA 19-9 was improved on 05/19/2012. Restaging CT scan 05/31/2012 showed a decrease in the size of the pancreatic mass. She  completed cycle 8 FOLFOX on 07/07/2012. 2. Delayed nausea following cycle 1 of FOLFOX. She receives Aloxi premedication. She takes prophylactic dexamethasone 8 mg twice daily for 2 days beginning day 2. No nausea following the most recent cycle of FOLFOX 3. Pain secondary to the locally advanced pancreas mass. The pain overall is improved. She continues OxyContin with oxycodone as needed. 4. Small cell lung cancer. Left perihilar/lung mass was stable on chest x-ray 04/05/2012 and on CT 05/31/2012. She completed radiation to the left chest mass on 06/29/2012 through 07/13/2012. 5. Staging PET scan 02/12/2012 confirming a hypermetabolic left lung mass, hypermetabolic pancreas mass, hypermetabolic peritoneal implant. Status post bronchoscopy confirming a left endobronchial mass and left hilar mass on 02/24/2012. Biopsy of both areas confirmed small cell carcinoma. 6. Baseline nausea. Question related to tumor abutting the stomach. Improved. 7. Constipation. She continues daily MiraLAX. 8. Neutropenia/thrombocytopenia 07/07/2012 secondary to chemotherapy. She received Neulasta with cycle 8 FOLFOX. 9. Question early oxaliplatin neuropathy. 10. Skin rash related to radiation. Radiation will evaluate the rash today.  Disposition-Victoria Price appears stable. She has progressive thrombocytopenia on labs today. We are canceling the chemotherapy (cycle 9 FOLFOX ) scheduled on 07/21/2012 and rescheduling to 07/28/2012. We are referring her for a restaging evaluation 08/08/2012. She will return for a followup visit with Dr. Truett Perna on 08/11/2012. She will contact the office in the interim with any problems.  Plan reviewed with Dr. Truett Perna.   Victoria Price ANP/GNP-BC

## 2012-07-18 NOTE — Progress Notes (Signed)
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 This is very pruritic. She is approximately one week out from completion of radiation therapy to the left lung and mediastinum. She received 2000 cGy in 10 sessions.  Physical examination: There is a papular erythematous rash along her anterior posterior left chest consistent with radiation dermatitis.  Impression: Radiation dermatitis.  Plan: I told her that she may use hydrocortisone 1% cream along with diphenhydramine when necessary. She'll maintain her followup visit with me later this month.

## 2012-07-19 ENCOUNTER — Other Ambulatory Visit: Payer: Self-pay | Admitting: Oncology

## 2012-07-21 ENCOUNTER — Telehealth: Payer: Self-pay | Admitting: Oncology

## 2012-07-21 ENCOUNTER — Inpatient Hospital Stay: Payer: BC Managed Care – PPO

## 2012-07-21 ENCOUNTER — Encounter: Payer: BC Managed Care – PPO | Admitting: Nutrition

## 2012-07-21 NOTE — Telephone Encounter (Signed)
l/m with tx appts    aom

## 2012-07-25 ENCOUNTER — Other Ambulatory Visit: Payer: Self-pay | Admitting: *Deleted

## 2012-07-25 DIAGNOSIS — C259 Malignant neoplasm of pancreas, unspecified: Secondary | ICD-10-CM

## 2012-07-25 MED ORDER — OXYCODONE HCL 5 MG PO TABS: ORAL_TABLET | ORAL | Status: DC

## 2012-07-27 ENCOUNTER — Telehealth: Payer: Self-pay | Admitting: *Deleted

## 2012-07-27 DIAGNOSIS — C259 Malignant neoplasm of pancreas, unspecified: Secondary | ICD-10-CM

## 2012-07-27 MED ORDER — OXYCODONE HCL 20 MG PO TB12: 20.0000 mg | ORAL_TABLET | Freq: Two times a day (BID) | ORAL | Status: DC

## 2012-07-27 NOTE — Telephone Encounter (Signed)
Patient called to report has been coughing up "green crud" today. No fever or dyspnea. Tends to occur after she has been sleeping. Told her it could be sinus drainage. Suggested she push fluids and come in as scheduled tomorrow. Counts will be evaluated and nurse will assess her in treatment room and let MD know if she looks like she needs CXR or to be seen. She also reports she has been having increase in her left chest wall pain in past couple weeks and has been taking her oxycodone more frequently than usual plus her oxycontin bid.  Also needs refill on her Oxycontin tomorrow.

## 2012-07-28 ENCOUNTER — Other Ambulatory Visit (HOSPITAL_BASED_OUTPATIENT_CLINIC_OR_DEPARTMENT_OTHER): Payer: BC Managed Care – PPO | Admitting: Lab

## 2012-07-28 ENCOUNTER — Ambulatory Visit: Payer: BC Managed Care – PPO | Admitting: Nutrition

## 2012-07-28 ENCOUNTER — Ambulatory Visit (HOSPITAL_BASED_OUTPATIENT_CLINIC_OR_DEPARTMENT_OTHER): Payer: BC Managed Care – PPO

## 2012-07-28 ENCOUNTER — Other Ambulatory Visit: Payer: Self-pay | Admitting: Oncology

## 2012-07-28 VITALS — BP 131/76 | HR 59 | Temp 97.0°F | Resp 20

## 2012-07-28 DIAGNOSIS — C251 Malignant neoplasm of body of pancreas: Secondary | ICD-10-CM

## 2012-07-28 DIAGNOSIS — C259 Malignant neoplasm of pancreas, unspecified: Secondary | ICD-10-CM

## 2012-07-28 DIAGNOSIS — Z5111 Encounter for antineoplastic chemotherapy: Secondary | ICD-10-CM

## 2012-07-28 DIAGNOSIS — C3430 Malignant neoplasm of lower lobe, unspecified bronchus or lung: Secondary | ICD-10-CM

## 2012-07-28 LAB — COMPREHENSIVE METABOLIC PANEL
AST: 30 U/L (ref 0–37)
Alkaline Phosphatase: 257 U/L — ABNORMAL HIGH (ref 39–117)
BUN: 6 mg/dL (ref 6–23)
Glucose, Bld: 132 mg/dL — ABNORMAL HIGH (ref 70–99)
Potassium: 3.5 mEq/L (ref 3.5–5.3)
Sodium: 134 mEq/L — ABNORMAL LOW (ref 135–145)
Total Bilirubin: 0.4 mg/dL (ref 0.3–1.2)
Total Protein: 6.2 g/dL (ref 6.0–8.3)

## 2012-07-28 LAB — CBC WITH DIFFERENTIAL/PLATELET
BASO%: 0.4 % (ref 0.0–2.0)
Basophils Absolute: 0 10*3/uL (ref 0.0–0.1)
EOS%: 2.4 % (ref 0.0–7.0)
HGB: 12 g/dL (ref 11.6–15.9)
MCH: 32.4 pg (ref 25.1–34.0)
MCHC: 34 g/dL (ref 31.5–36.0)
MCV: 95.4 fL (ref 79.5–101.0)
MONO%: 19.4 % — ABNORMAL HIGH (ref 0.0–14.0)
RDW: 16.1 % — ABNORMAL HIGH (ref 11.2–14.5)
lymph#: 0.8 10*3/uL — ABNORMAL LOW (ref 0.9–3.3)

## 2012-07-28 MED ORDER — FLUOROURACIL CHEMO INJECTION 2.5 GM/50ML
400.0000 mg/m2 | Freq: Once | INTRAVENOUS | Status: AC
  Administered 2012-07-28: 750 mg via INTRAVENOUS
  Filled 2012-07-28: qty 15

## 2012-07-28 MED ORDER — LEUCOVORIN CALCIUM INJECTION 350 MG
400.0000 mg/m2 | Freq: Once | INTRAVENOUS | Status: AC
  Administered 2012-07-28: 732 mg via INTRAVENOUS
  Filled 2012-07-28: qty 36.6

## 2012-07-28 MED ORDER — DEXAMETHASONE SODIUM PHOSPHATE 10 MG/ML IJ SOLN
10.0000 mg | Freq: Once | INTRAMUSCULAR | Status: AC
  Administered 2012-07-28: 10 mg via INTRAVENOUS

## 2012-07-28 MED ORDER — SODIUM CHLORIDE 0.9 % IJ SOLN
10.0000 mL | INTRAMUSCULAR | Status: DC | PRN
  Filled 2012-07-28: qty 10

## 2012-07-28 MED ORDER — OXALIPLATIN CHEMO INJECTION 100 MG/20ML
45.0000 mg/m2 | Freq: Once | INTRAVENOUS | Status: AC
  Administered 2012-07-28: 80 mg via INTRAVENOUS
  Filled 2012-07-28: qty 16

## 2012-07-28 MED ORDER — SODIUM CHLORIDE 0.9 % IV SOLN
2400.0000 mg/m2 | INTRAVENOUS | Status: DC
  Administered 2012-07-28: 4400 mg via INTRAVENOUS
  Filled 2012-07-28: qty 88

## 2012-07-28 MED ORDER — DEXTROSE 5 % IV SOLN
Freq: Once | INTRAVENOUS | Status: AC
  Administered 2012-07-28: 11:00:00 via INTRAVENOUS

## 2012-07-28 MED ORDER — PALONOSETRON HCL INJECTION 0.25 MG/5ML
0.2500 mg | Freq: Once | INTRAVENOUS | Status: AC
  Administered 2012-07-28: 0.25 mg via INTRAVENOUS

## 2012-07-28 MED ORDER — HEPARIN SOD (PORK) LOCK FLUSH 100 UNIT/ML IV SOLN
500.0000 [IU] | Freq: Once | INTRAVENOUS | Status: DC | PRN
  Filled 2012-07-28: qty 5

## 2012-07-28 NOTE — Progress Notes (Signed)
Victoria Price wanted to visit with me while she was in the chemotherapy area.  I had a few minutes, so I spent some time talking to her about her continued ongoing allergies and food intolerances.  She reports she no longer tolerates much meat of any kind and was looking for some additional information on protein sources that were considered vegetarian.  Her weight continues to be stable and was documented as 150.6.  NUTRITION DIAGNOSIS:  Unintended weight loss has resolved.    New diagnosis of food and nutrition related knowledge deficit related to patient's ongoing issues with food intolerances and allergies as evidenced by patient's continued verbalizations of incomplete information.  INTERVENTION:  I educated the patient and provided fact sheets on vegetarian nutrition.  I have encouraged her to continue adding in vegetarian sources of proteins.  I have also encouraged her to explore some other options of oral nutrition supplements.  The patient is agreeable and verbalizes understanding.  MONITORING/EVALUATION/GOALS:  The patient will continue to work to consume adequate calories and protein for weight maintenance.  NEXT VISIT:  Thursday, August 29 during chemotherapy.   ______________________________ Zenovia Jarred, RD, CSO, LDN Clinical Nutrition Specialist BN/MEDQ  D:  07/28/2012  T:  07/28/2012  Job:  [id number]

## 2012-07-28 NOTE — Patient Instructions (Signed)
[facility] Discharge Instructions for Patients Receiving Chemotherapy  Today you received the following chemotherapy agents Oxcaliplatin, Leucovorin, 5FU  To help prevent nausea and vomiting after your treatment, we encourage you to take your nausea medication as needed. Begin taking it at 4 pm and take it as often as prescribed for the next 48 hours.   If you develop nausea and vomiting that is not controlled by your nausea medication, call the clinic. If it is after clinic hours your family physician or the after hours number for the clinic or go to the Emergency Department.   BELOW ARE SYMPTOMS THAT SHOULD BE REPORTED IMMEDIATELY:  *FEVER GREATER THAN 100.5 F  *CHILLS WITH OR WITHOUT FEVER  NAUSEA AND VOMITING THAT IS NOT CONTROLLED WITH YOUR NAUSEA MEDICATION  *UNUSUAL SHORTNESS OF BREATH  *UNUSUAL BRUISING OR BLEEDING  TENDERNESS IN MOUTH AND THROAT WITH OR WITHOUT PRESENCE OF ULCERS  *URINARY PROBLEMS  *BOWEL PROBLEMS  UNUSUAL RASH Items with * indicate a potential emergency and should be followed up as soon as possible.  One of the nurses will contact you 24 hours after your treatment. Please let the nurse know about any problems that you may have experienced. Feel free to call the clinic you have any questions or concerns. The clinic phone number is [phone].   I have been informed and understand all the instructions given to me. I know to contact the clinic, my physician, or go to the Emergency Department if any problems should occur. I do not have any questions at this time, but understand that I may call the clinic during office hours   should I have any questions or need assistance in obtaining follow up care.    __________________________________________  _____________  __________ Signature of Patient or Authorized Representative            Date                   Time    __________________________________________ Nurse's Signature

## 2012-07-28 NOTE — Progress Notes (Signed)
Pt stated she is using oxycontin BID and using her oxycodone q4hrs taking 2 tabs each time. The L flank pain has increased over the last 2-3 weeks. S/w Dr Truett Perna and he advised that pt can increase oxycontin to 40mg  (2 tabs) in AM and 20mg  (1tab) in PM. Pt expressed understanding and stated she would start tomorrow morning.

## 2012-07-30 ENCOUNTER — Ambulatory Visit (HOSPITAL_BASED_OUTPATIENT_CLINIC_OR_DEPARTMENT_OTHER): Payer: BC Managed Care – PPO

## 2012-07-30 VITALS — BP 135/83 | HR 73 | Temp 98.0°F | Resp 16

## 2012-07-30 DIAGNOSIS — Z5189 Encounter for other specified aftercare: Secondary | ICD-10-CM

## 2012-07-30 DIAGNOSIS — C251 Malignant neoplasm of body of pancreas: Secondary | ICD-10-CM

## 2012-07-30 DIAGNOSIS — C259 Malignant neoplasm of pancreas, unspecified: Secondary | ICD-10-CM

## 2012-07-30 DIAGNOSIS — C3430 Malignant neoplasm of lower lobe, unspecified bronchus or lung: Secondary | ICD-10-CM

## 2012-07-30 MED ORDER — PEGFILGRASTIM INJECTION 6 MG/0.6ML
6.0000 mg | Freq: Once | SUBCUTANEOUS | Status: AC
  Administered 2012-07-30: 6 mg via SUBCUTANEOUS

## 2012-08-02 ENCOUNTER — Telehealth: Payer: Self-pay | Admitting: *Deleted

## 2012-08-02 NOTE — Telephone Encounter (Signed)
Call from pt reporting large hemorrhoid she noticed about 12 days ago. Using OTC creams. Reviewed with Dr. Truett Perna: OK to continue OTC treatment, try Anusol HC. Will evaluate at next visit. Pt voiced understanding.

## 2012-08-04 ENCOUNTER — Telehealth: Payer: Self-pay | Admitting: *Deleted

## 2012-08-04 ENCOUNTER — Other Ambulatory Visit: Payer: Self-pay | Admitting: *Deleted

## 2012-08-04 DIAGNOSIS — K649 Unspecified hemorrhoids: Secondary | ICD-10-CM

## 2012-08-04 MED ORDER — HYDROCORTISONE ACETATE 25 MG RE SUPP: 25.0000 mg | Freq: Two times a day (BID) | RECTAL | Status: AC

## 2012-08-04 NOTE — Telephone Encounter (Signed)
Received call from pt wanting prescription Anusol HC for hemorrhoids, stated "my pharmacy said it was prescription only"  Returned call to pt and explained that there is OTC Anusol HC and higher dose that can be prescribed.  Pt stated "I want the prescription because this is very painful"  Per Dr. Truett Perna, Anusol [facility] Supp ordered.

## 2012-08-08 ENCOUNTER — Encounter: Payer: Self-pay | Admitting: Radiation Oncology

## 2012-08-08 DIAGNOSIS — Z923 Personal history of irradiation: Secondary | ICD-10-CM | POA: Insufficient documentation

## 2012-08-09 ENCOUNTER — Other Ambulatory Visit: Payer: Self-pay | Admitting: Oncology

## 2012-08-09 ENCOUNTER — Ambulatory Visit (HOSPITAL_COMMUNITY)
Admission: RE | Admit: 2012-08-09 | Discharge: 2012-08-09 | Disposition: A | Discharge status: Home / Self Care | Payer: BC Managed Care – PPO | Source: Ambulatory Visit | Attending: Nurse Practitioner | Admitting: Nurse Practitioner

## 2012-08-09 DIAGNOSIS — C775 Secondary and unspecified malignant neoplasm of intrapelvic lymph nodes: Secondary | ICD-10-CM | POA: Insufficient documentation

## 2012-08-09 DIAGNOSIS — C3490 Malignant neoplasm of unspecified part of unspecified bronchus or lung: Secondary | ICD-10-CM | POA: Insufficient documentation

## 2012-08-09 DIAGNOSIS — C786 Secondary malignant neoplasm of retroperitoneum and peritoneum: Secondary | ICD-10-CM | POA: Insufficient documentation

## 2012-08-09 DIAGNOSIS — C259 Malignant neoplasm of pancreas, unspecified: Secondary | ICD-10-CM | POA: Insufficient documentation

## 2012-08-09 DIAGNOSIS — I81 Portal vein thrombosis: Secondary | ICD-10-CM | POA: Insufficient documentation

## 2012-08-09 MED ORDER — IOHEXOL 300 MG/ML  SOLN
100.0000 mL | Freq: Once | INTRAMUSCULAR | Status: AC | PRN
  Administered 2012-08-09: 100 mL via INTRAVENOUS

## 2012-08-10 ENCOUNTER — Other Ambulatory Visit: Payer: Self-pay | Admitting: *Deleted

## 2012-08-10 ENCOUNTER — Encounter: Payer: Self-pay | Admitting: Radiation Oncology

## 2012-08-10 ENCOUNTER — Ambulatory Visit
Admission: RE | Admit: 2012-08-10 | Discharge: 2012-08-10 | Disposition: A | Discharge status: Home / Self Care | Payer: BC Managed Care – PPO | Source: Ambulatory Visit | Attending: Radiation Oncology | Admitting: Radiation Oncology

## 2012-08-10 VITALS — BP 132/78 | HR 74 | Temp 97.9°F | Resp 20 | Wt 147.5 lb

## 2012-08-10 DIAGNOSIS — C3490 Malignant neoplasm of unspecified part of unspecified bronchus or lung: Secondary | ICD-10-CM

## 2012-08-10 DIAGNOSIS — C259 Malignant neoplasm of pancreas, unspecified: Secondary | ICD-10-CM

## 2012-08-10 MED ORDER — DEXAMETHASONE 4 MG PO TABS: 8.0000 mg | ORAL_TABLET | Freq: Two times a day (BID) | ORAL | Status: DC

## 2012-08-10 NOTE — Progress Notes (Addendum)
Pt states less fatigue, denies cough, sob w/exertion only. She is taking Oxycontin 20 mg 2 tabs in morning, 1 tab qhs per Dr Truett Perna. Her pain is "in my pancreas". Taking Oxy-IR twice a day for breakthru.  Pt continues to apply Cortisone cream to left upper back for slight hyperpigmentation, skin irritation.

## 2012-08-10 NOTE — Progress Notes (Signed)
Followup note:  [name] returns today approximately 1 month following completion of a two-week course of palliative radiotherapy to her left lung/mediastinum in the management of her small cell carcinoma of the lung. She is also dealing with metastatic carcinoma the pancreas and has been undergoing chemotherapy under the direction of Dr. Truett Perna who she will see tomorrow. She states that her breathing is improved. Her followup CT scan of the chest from yesterday shows marked reduction in volume of the left perihilar mass and resolution of the left lower lobe nodularity. His scans of the abdomen showed progression of retroperitoneal metastatic implants with multiple new enlarged implants within the retroperitoneal space. Pancreatic head mass has not changed significantly. She remains on OxyContin  for her abdominal/pancreatic discomfort.  Physical examination: Lower and oriented. She is in no acute distress. Wt Readings from Last 3 Encounters:  08/10/12 147 lb 8 oz (66.906 kg)  07/18/12 150 lb 9.6 oz (68.312 kg)  07/07/12 152 lb (68.947 kg)   Temp Readings from Last 3 Encounters:  08/10/12 97.9 F (36.6 C) Oral  07/30/12 98 F (36.7 C) Oral  07/28/12 97 F (36.1 C) Oral   BP Readings from Last 3 Encounters:  08/10/12 132/78  07/30/12 135/83  07/28/12 131/76   Pulse Readings from Last 3 Encounters:  08/10/12 74  07/30/12 73  07/28/12 59   Head and neck examination: Grossly unremarkable. Nodes: Without palpable cervical or supraclavicular lymphadenopathy. Chest: Lungs are clear. There is faint erythema along her left posterior chest from her previous radiation therapy.  Impression: Satisfactory palliation of her left chest/mediastinal small cell carcinoma. She appears to disease progression from her pancreatic cancer, she'll see Dr. Truett Perna tomorrow.  Plan: As discussed above. I've not scheduled the patient for a formal followup visit, but I would more than happy to see her in the  future should the need arise.

## 2012-08-11 ENCOUNTER — Other Ambulatory Visit (HOSPITAL_BASED_OUTPATIENT_CLINIC_OR_DEPARTMENT_OTHER): Payer: BC Managed Care – PPO | Admitting: Lab

## 2012-08-11 ENCOUNTER — Ambulatory Visit (HOSPITAL_BASED_OUTPATIENT_CLINIC_OR_DEPARTMENT_OTHER): Payer: BC Managed Care – PPO

## 2012-08-11 ENCOUNTER — Telehealth: Payer: Self-pay | Admitting: *Deleted

## 2012-08-11 ENCOUNTER — Other Ambulatory Visit: Payer: Self-pay | Admitting: *Deleted

## 2012-08-11 ENCOUNTER — Ambulatory Visit: Payer: BC Managed Care – PPO | Admitting: Nutrition

## 2012-08-11 ENCOUNTER — Telehealth: Payer: Self-pay | Admitting: Oncology

## 2012-08-11 ENCOUNTER — Ambulatory Visit (HOSPITAL_BASED_OUTPATIENT_CLINIC_OR_DEPARTMENT_OTHER): Payer: BC Managed Care – PPO | Admitting: Oncology

## 2012-08-11 VITALS — BP 133/76 | HR 70 | Temp 97.1°F | Resp 20 | Ht 61.0 in | Wt 145.8 lb

## 2012-08-11 DIAGNOSIS — C3430 Malignant neoplasm of lower lobe, unspecified bronchus or lung: Secondary | ICD-10-CM

## 2012-08-11 DIAGNOSIS — C259 Malignant neoplasm of pancreas, unspecified: Secondary | ICD-10-CM

## 2012-08-11 DIAGNOSIS — C786 Secondary malignant neoplasm of retroperitoneum and peritoneum: Secondary | ICD-10-CM

## 2012-08-11 DIAGNOSIS — Z5111 Encounter for antineoplastic chemotherapy: Secondary | ICD-10-CM

## 2012-08-11 DIAGNOSIS — C251 Malignant neoplasm of body of pancreas: Secondary | ICD-10-CM

## 2012-08-11 DIAGNOSIS — C779 Secondary and unspecified malignant neoplasm of lymph node, unspecified: Secondary | ICD-10-CM

## 2012-08-11 LAB — CBC WITH DIFFERENTIAL/PLATELET
BASO%: 0.1 % (ref 0.0–2.0)
Basophils Absolute: 0 10*3/uL (ref 0.0–0.1)
EOS%: 0.6 % (ref 0.0–7.0)
HCT: 35.7 % (ref 34.8–46.6)
HGB: 12.1 g/dL (ref 11.6–15.9)
LYMPH%: 12.1 % — ABNORMAL LOW (ref 14.0–49.7)
MCH: 32.5 pg (ref 25.1–34.0)
MCHC: 33.9 g/dL (ref 31.5–36.0)
MCV: 96 fL (ref 79.5–101.0)
MONO%: 12.4 % (ref 0.0–14.0)
NEUT%: 74.8 % (ref 38.4–76.8)
Platelets: 92 10*3/uL — ABNORMAL LOW (ref 145–400)
lymph#: 0.9 10*3/uL (ref 0.9–3.3)

## 2012-08-11 LAB — COMPREHENSIVE METABOLIC PANEL (CC13)
ALT: 30 U/L (ref 0–55)
AST: 29 U/L (ref 5–34)
Albumin: 3.2 g/dL — ABNORMAL LOW (ref 3.5–5.0)
CO2: 26 mEq/L (ref 22–29)
Chloride: 101 mEq/L (ref 98–107)
Creatinine: 0.8 mg/dL (ref 0.6–1.1)
Potassium: 3.4 mEq/L — ABNORMAL LOW (ref 3.5–5.1)
Sodium: 137 mEq/L (ref 136–145)
Total Protein: 6.3 g/dL — ABNORMAL LOW (ref 6.4–8.3)

## 2012-08-11 LAB — CANCER ANTIGEN 19-9: CA 19-9: 141.9 U/mL — ABNORMAL HIGH (ref <35.0)

## 2012-08-11 LAB — CORRECTED CALCIUM (CC13): Calcium, Corrected: 9.8 mg/dL (ref 8.4–10.4)

## 2012-08-11 MED ORDER — SODIUM CHLORIDE 0.9 % IJ SOLN
10.0000 mL | INTRAMUSCULAR | Status: DC | PRN
  Administered 2012-08-11: 10 mL
  Filled 2012-08-11: qty 10

## 2012-08-11 MED ORDER — DEXAMETHASONE SODIUM PHOSPHATE 10 MG/ML IJ SOLN
10.0000 mg | Freq: Once | INTRAMUSCULAR | Status: AC
  Administered 2012-08-11: 10 mg via INTRAVENOUS

## 2012-08-11 MED ORDER — SODIUM CHLORIDE 0.9 % IV SOLN
Freq: Once | INTRAVENOUS | Status: AC
  Administered 2012-08-11: 13:00:00 via INTRAVENOUS

## 2012-08-11 MED ORDER — ONDANSETRON 8 MG/50ML IVPB (CHCC)
8.0000 mg | Freq: Once | INTRAVENOUS | Status: AC
  Administered 2012-08-11: 8 mg via INTRAVENOUS

## 2012-08-11 MED ORDER — PACLITAXEL PROTEIN-BOUND CHEMO INJECTION 100 MG
100.0000 mg/m2 | Freq: Once | INTRAVENOUS | Status: AC
  Administered 2012-08-11: 175 mg via INTRAVENOUS
  Filled 2012-08-11: qty 35

## 2012-08-11 MED ORDER — HEPARIN SOD (PORK) LOCK FLUSH 100 UNIT/ML IV SOLN
500.0000 [IU] | Freq: Once | INTRAVENOUS | Status: AC | PRN
  Administered 2012-08-11: 500 [IU]
  Filled 2012-08-11: qty 5

## 2012-08-11 MED ORDER — SODIUM CHLORIDE 0.9 % IV SOLN
700.0000 mg/m2 | Freq: Once | INTRAVENOUS | Status: AC
  Administered 2012-08-11: 1178 mg via INTRAVENOUS
  Filled 2012-08-11: qty 31

## 2012-08-11 MED ORDER — ONDANSETRON HCL 8 MG PO TABS: 8.0000 mg | ORAL_TABLET | Freq: Two times a day (BID) | ORAL | Status: DC | PRN

## 2012-08-11 MED ORDER — OXYCODONE HCL 20 MG PO TB12: 20.0000 mg | ORAL_TABLET | Freq: Two times a day (BID) | ORAL | Status: DC

## 2012-08-11 NOTE — Patient Instructions (Signed)
[facility] Discharge Instructions for Patients Receiving Chemotherapy  Today you received the following chemotherapy agents Abraxane/Gemzar To help prevent nausea and vomiting after your treatment, we encourage you to take your nausea medication as prescribed. If you develop nausea and vomiting that is not controlled by your nausea medication, call the clinic. If it is after clinic hours your family physician or the after hours number for the clinic or go to the Emergency Department.   BELOW ARE SYMPTOMS THAT SHOULD BE REPORTED IMMEDIATELY:  *FEVER GREATER THAN 100.5 F  *CHILLS WITH OR WITHOUT FEVER  NAUSEA AND VOMITING THAT IS NOT CONTROLLED WITH YOUR NAUSEA MEDICATION  *UNUSUAL SHORTNESS OF BREATH  *UNUSUAL BRUISING OR BLEEDING  TENDERNESS IN MOUTH AND THROAT WITH OR WITHOUT PRESENCE OF ULCERS  *URINARY PROBLEMS  *BOWEL PROBLEMS  UNUSUAL RASH Items with * indicate a potential emergency and should be followed up as soon as possible.  One of the nurses will contact you 24 hours after your treatment. Please let the nurse know about any problems that you may have experienced. Feel free to call the clinic you have any questions or concerns. The clinic phone number is [phone].   I have been informed and understand all the instructions given to me. I know to contact the clinic, my physician, or go to the Emergency Department if any problems should occur. I do not have any questions at this time, but understand that I may call the clinic during office hours   should I have any questions or need assistance in obtaining follow up care.    __________________________________________  _____________  __________ Signature of Patient or Authorized Representative            Date                   Time    __________________________________________ Nurse's Signature    

## 2012-08-11 NOTE — Patient Instructions (Signed)
Victoria Price 21-Feb-1956 [id number]  [facility] Discharge Instructions  Your exam findings, labs and results were discussed with your MD today.  Filed Vitals:   08/11/12 1108  BP: 133/76  Pulse: 70  Temp: 97.1 F (36.2 C)  Resp: 20   Current outpatient prescriptions:ALPRAZolam (XANAX) 0.5 MG tablet, Take half to one PO Q 8 hours as needed for anxiety or sleep., Disp: 90 tablet, Rfl: 0;  Chlorpheniramine-Phenylephrine 3.5-10 MG TABS, Take 1 tablet by mouth daily as needed. For allergies., Disp: , Rfl: ;  EPINEPHrine (EPIPEN) 0.3 mg/0.3 mL DEVI, Inject 0.3 mg into the muscle once. For anaphylaxis., Disp: , Rfl:  Estradiol Acetate 0.05 MG/24HR RING, Place 0.05 each vaginally every 3 (three) months. Last insert 02/08/2012., Disp: , Rfl: ;  fish oil-omega-3 fatty acids 1000 MG capsule, Take 1 g by mouth daily. , Disp: , Rfl: ;  fluticasone (FLONASE) 50 MCG/ACT nasal spray, Place 2 sprays into the nose daily as needed. For congestion., Disp: , Rfl:  hydrocortisone (ANUSOL-HC) 25 MG suppository, Place 1 suppository (25 mg total) rectally 2 (two) times daily., Disp: 12 suppository, Rfl: 1;  ipratropium (ATROVENT HFA) 17 MCG/ACT inhaler, Inhale 2 puffs into the lungs every 6 (six) hours as needed for wheezing. wheezing, Disp: 1 Inhaler, Rfl: 1;  levothyroxine (SYNTHROID, LEVOTHROID) 88 MCG tablet, Take 44 mcg by mouth daily before breakfast. , Disp: , Rfl:  lidocaine-prilocaine (EMLA) cream, Apply topically as needed. To port-a-cath one hour prior to chemo., Disp: 30 g, Rfl: 0;  ondansetron (ZOFRAN) 8 MG tablet, Take 1 tablet (8 mg total) by mouth every 12 (twelve) hours as needed for nausea (start on 3rd day after chemo tx)., Disp: 20 tablet, Rfl: 2;  oxyCODONE (OXY IR/ROXICODONE) 5 MG immediate release tablet, Take one to two tablets PO every 4 hours as needed for pain., Disp: 75 tablet, Rfl: 0 oxyCODONE (OXYCONTIN) 40 MG 12 hr tablet, Take 40 mg by mouth every 12 (twelve) hours., Disp: ,  Rfl: ;  Polyethyl Glycol-Propyl Glycol (SYSTANE ULTRA OP), Place 1 drop into both eyes 2 (two) times daily. , Disp: , Rfl: ;  polyethylene glycol (MIRALAX / GLYCOLAX) packet, Take 17 g by mouth daily., Disp: , Rfl: ;  DISCONTD: oxyCODONE (OXYCONTIN) 20 MG 12 hr tablet, Take 1 tablet (20 mg total) by mouth every 12 (twelve) hours., Disp: 60 tablet, Rfl: 0 DISCONTD: oxyCODONE (OXYCONTIN) 20 MG 12 hr tablet, Take 1 tablet (20 mg total) by mouth every 12 (twelve) hours., Disp: 60 tablet, Rfl: 0;  dexamethasone (DECADRON) 4 MG tablet, Take 2 tablets (8 mg total) by mouth 2 (two) times daily with a meal. Take 8 mg twice daily for 2 days beginning day after each chemo, Disp: 16 tablet, Rfl: 1 LORazepam (ATIVAN) 1 MG tablet, Take 1 tablet (1 mg total) by mouth every 6 (six) hours as needed (nausea). Do not take Xanax when on Ativan, Disp: 60 tablet, Rfl: 0;  prochlorperazine (COMPAZINE) 10 MG tablet, Take 10 mg by mouth every 6 (six) hours as needed., Disp: , Rfl: ;  DISCONTD: testosterone cypionate (DEPOTESTOTERONE CYPIONATE) 200 MG/ML injection, Inject into the muscle., Disp: , Rfl:  No current facility-administered medications for this visit. Facility-Administered Medications Ordered in Other Visits: 0.9 %  sodium chloride infusion, , Intravenous, Once, Ladene Artist, MD, Last Rate: 20 mL/hr at 08/11/12 1255;  dexamethasone (DECADRON) injection 10 mg, 10 mg, Intravenous, Once, Ladene Artist, MD, 10 mg at 08/11/12 1307;  gemcitabine (GEMZAR) 1,178 mg  in sodium chloride 0.9 % 100 mL chemo infusion, 700 mg/m2 (Treatment Plan Actual), Intravenous, Once, Ladene Artist, MD heparin lock flush 100 unit/mL, 500 Units, Intracatheter, Once PRN, Ladene Artist, MD;  ondansetron [facility]) IVPB 8 mg, 8 mg, Intravenous, Once, Ladene Artist, MD, 8 mg at 08/11/12 1307;  PACLitaxel-protein bound (ABRAXANE) chemo infusion 175 mg, 100 mg/m2 (Treatment Plan Actual), Intravenous, Once, Ladene Artist, MD;  sodium chloride 0.9 %  injection 10 mL, 10 mL, Intracatheter, PRN, Ladene Artist, MD DISCONTD: fluorouracil (ADRUCIL) 4,400 mg in sodium chloride 0.9 % 150 mL chemo infusion, 2,400 mg/m2 (Treatment Plan Actual), Intravenous, 1 day or 1 dose, Ladene Artist, MD, 4,400 mg at 04/28/12 1535;  DISCONTD: sodium chloride 0.9 % injection 10 mL, 10 mL, Intracatheter, PRN, Ladene Artist, MD  Please visit scheduling to obtain calendar for future appointments.  Please call the [facility] at [phone] during business hours should you have any further questions or need assistance in obtaining follow-up care. If you have a medical emergency, please dial 911.  Special Instructions:

## 2012-08-11 NOTE — Telephone Encounter (Signed)
Per POF and staff message I have scheduled appts. JWM

## 2012-08-11 NOTE — Progress Notes (Signed)
[facility]    OFFICE PROGRESS NOTE   INTERVAL HISTORY:   She returns as scheduled. She reports increased pain. She now has low back pain in addition to the left upper abdomen/posterior lateral chest discomfort. She is taking OxyContin at a dose of 40 mg in the morning and 20 mg at night. She has pain during the night.  She reports irregular bowel habits. No neuropathy symptoms.  Objective:  Vital signs in last 24 hours:  Blood pressure 133/76, pulse 70, temperature 97.1 F (36.2 C), temperature source Oral, resp. rate 20, height 5\' 1"  (1.549 m), weight 145 lb 12.8 oz (66.134 kg).    HEENT: No thrush or ulcers Resp: Lungs clear bilaterally Cardio: Regular rate and rhythm GI: No hepatomegaly, no mass, no apparent ascites, tender in the mid and right upper abdomen, there is a 1-1.5 cm firm external hemorrhoid at the left anal verge Vascular: No leg edema Neuro: Alert and oriented,? Mild decrease in vibratory sense at the fingertips  Skin: No rash. 1 cm cutaneous nodule at the right flank   Portacath/PICC-without erythema  Lab Results:  Lab Results  Component Value Date   WBC 7.8 08/11/2012   HGB 12.1 08/11/2012   HCT 35.7 08/11/2012   MCV 96.0 08/11/2012   PLT 92* 08/11/2012   ANC 5.8  X-rays: CT of the chest and abdomen on 08/09/2012-markedly reduction in the volume of the left hilar mass and resolution of left lower lobe nodularity, the mass at the pancreas head is unchanged. The mass is noted to involve the superior mesenteric artery and celiac trunk. Interval progression of retroperitoneal metastatic disease with multiple nodular implants. Nodular implants have enlarged and some are new. 10 mm nodule in the right flank soft tissue.   Medications: I have reviewed the patient's current medications.  Assessment/Plan: 1. Locally advanced pancreas cancer, adenosquamous carcinoma, presenting with a pancreas body mass and CT/ultrasound evidence of portal  vein/SMV invasion. She completed cycle 1 of FOLFOX 03/10/2012. She completed cycle 5 on 05/19/2012. CA 19-9 was improved on 05/19/2012. Restaging CT scan 05/31/2012 showed a decrease in the size of the pancreatic mass. She completed cycle 8 FOLFOX on 07/07/2012 and cycle 9 on 07/28/2012. A restaging CT 08/09/2012 confirmed disease progression involving retroperitoneal nodules. 2. Delayed nausea following cycle 1 of FOLFOX. She receives Aloxi premedication. She takes prophylactic dexamethasone 8 mg twice daily for 2 days beginning day 2. No nausea following the most recent cycle of FOLFOX 3. Pain secondary to the locally advanced pancreas mass. The pain has worsened over the past month. We increase the OxyContin to 40 mg twice daily.  4. Small cell lung cancer. Left perihilar/lung mass was stable on chest x-ray 04/05/2012 and on CT 05/31/2012. She completed radiation to the left chest mass on 06/29/2012 through 07/13/2012. The mass is markedly decreased on the CT 08/09/2012 5. Staging PET scan 02/12/2012 confirming a hypermetabolic left lung mass, hypermetabolic pancreas mass, hypermetabolic peritoneal implant. Status post bronchoscopy confirming a left endobronchial mass and left hilar mass on 02/24/2012. Biopsy of both areas confirmed small cell carcinoma. 6. Baseline nausea. Question related to tumor abutting the stomach. Improved. 7. Irregular bowel habits secondary to chemotherapy and narcotics 8. Neutropenia/thrombocytopenia 07/07/2012 secondary to chemotherapy. She received Neulasta with cycle 8 and 9 FOLFOX. 9. Question early oxaliplatin neuropathy. 10. External hemorrhoid  Disposition:  I reviewed the results of the restaging CT with [name] and her family. The left lung mass is much improved consistent with  a response to radiation. However there appears to be progression of the pancreas cancer in the retroperitoneum. Her pain has worsened.  We decided to discontinue FOLFOX chemotherapy. I  discussed treatment options with [name]. We decided to proceed with second line chemotherapy consisting of gemcitabine/Abraxane. I reviewed the potential toxicities associated with this regimen including the chance for hematologic toxicity, and allergic reaction, fever, skin rash, alopecia, and neuropathy. She was given reading material is on these chemotherapy drugs today.  The plan is to begin gemcitabine/Abraxane on a day 1, day 8, day 15 schedule. She will be scheduled for an office visit on 08/25/2012.  The OxyContin was increased to 40 mg twice daily.  She will continue Anusol cream and sitz baths for the hemorrhoid.   I spent approximately 45 minutes with the patient and her family today, greater than 50% of the time was spent in counseling incoordination of care.  Thornton Papas, MD  08/11/2012  12:30 PM

## 2012-08-11 NOTE — Telephone Encounter (Signed)
gve the pt her sept 2013 appt calendar. Pt is aware that her chemo appts will be added. Sent michelle a staff message

## 2012-08-11 NOTE — Progress Notes (Signed)
Met with patient to assess for needs.  She states she has a good appetite and that her problems with constipation have improved.  Chemo teaching information given on Gemzar and Abraxane as she will begin this new treatment regimen today.  Encouraged patient to call for needs.  Will continue to follow.

## 2012-08-12 ENCOUNTER — Other Ambulatory Visit: Payer: Self-pay | Admitting: *Deleted

## 2012-08-12 DIAGNOSIS — C259 Malignant neoplasm of pancreas, unspecified: Secondary | ICD-10-CM

## 2012-08-12 MED ORDER — OXYCODONE HCL 5 MG PO TABS: ORAL_TABLET | ORAL | Status: DC

## 2012-08-12 NOTE — Telephone Encounter (Signed)
Received call from pt requesting re-fill of Oxycodone 5mg  pain med. Returned call and spoke with pt instructing her that prescription can be picked-up [facility] at her convenience.  Pt verbalized understanding and expressed appreciation for call.

## 2012-08-12 NOTE — Progress Notes (Signed)
Ms. Salameh reports difficulty with oral intake after radiation therapy. She states she did eat less and her weight has declined to about 145 pounds from about 150 pounds.  She continues to have issues with ongoing food allergies and intolerances, limiting her ability to increase her caloric intake.  NUTRITION DIAGNOSIS:  Food and nutrition-related knowledge deficit continues.  INTERVENTION:  I have provided support and encouragement for the patient to continue consuming foods that she knows she tolerates, along with oral nutrition supplements when able.  The patient verbalizes understanding and willingness to comply.  MONITORING/EVALUATION/GOALS:  The patient will increase oral intake to minimize further weight loss.  NEXT VISIT:  The patient will contact me for questions or concerns.   ______________________________ Zenovia Jarred, RD, CSO, LDN Clinical Nutrition Specialist BN/MEDQ  D:  08/11/2012  T:  08/12/2012  Job:  [phone]

## 2012-08-16 ENCOUNTER — Other Ambulatory Visit: Payer: Self-pay | Admitting: *Deleted

## 2012-08-16 DIAGNOSIS — C3490 Malignant neoplasm of unspecified part of unspecified bronchus or lung: Secondary | ICD-10-CM

## 2012-08-16 MED ORDER — ALPRAZOLAM 0.5 MG PO TABS: ORAL_TABLET | ORAL | Status: DC

## 2012-08-17 ENCOUNTER — Other Ambulatory Visit: Payer: Self-pay | Admitting: *Deleted

## 2012-08-17 DIAGNOSIS — C259 Malignant neoplasm of pancreas, unspecified: Secondary | ICD-10-CM

## 2012-08-17 MED ORDER — OXYCODONE HCL 40 MG PO TB12: 40.0000 mg | ORAL_TABLET | Freq: Two times a day (BID) | ORAL | Status: DC

## 2012-08-17 NOTE — Telephone Encounter (Signed)
Received call from pt requesting re-fill of Oxycodone 40mg  every 12 hours as needed.  08/11/12 Office visit, Dr. Truett Perna had increased Oxycodone from 20mg  to 40mg .  Returned call and spoke with pt informing her prescription can be picked-up at her convenience.  Pt verbalized understanding and expressed appreciation for call.

## 2012-08-18 ENCOUNTER — Ambulatory Visit: Payer: BC Managed Care – PPO

## 2012-08-18 ENCOUNTER — Encounter: Payer: Self-pay | Admitting: Oncology

## 2012-08-18 ENCOUNTER — Other Ambulatory Visit (HOSPITAL_BASED_OUTPATIENT_CLINIC_OR_DEPARTMENT_OTHER): Payer: BC Managed Care – PPO | Admitting: Lab

## 2012-08-18 DIAGNOSIS — C259 Malignant neoplasm of pancreas, unspecified: Secondary | ICD-10-CM

## 2012-08-18 LAB — CBC WITH DIFFERENTIAL/PLATELET
BASO%: 0.3 % (ref 0.0–2.0)
EOS%: 1 % (ref 0.0–7.0)
HCT: 29.2 % — ABNORMAL LOW (ref 34.8–46.6)
LYMPH%: 17.6 % (ref 14.0–49.7)
MCH: 32.5 pg (ref 25.1–34.0)
MCHC: 33.9 g/dL (ref 31.5–36.0)
NEUT%: 70.6 % (ref 38.4–76.8)
Platelets: 47 10*3/uL — ABNORMAL LOW (ref 145–400)
RBC: 3.05 10*6/uL — ABNORMAL LOW (ref 3.70–5.45)

## 2012-08-18 NOTE — Progress Notes (Signed)
Faxed fmla form to [employer] @ [phone].

## 2012-08-18 NOTE — Progress Notes (Signed)
Do not treat per Dr. Truett Perna with platelets at 47, Verbal with readback.

## 2012-08-24 ENCOUNTER — Other Ambulatory Visit: Payer: Self-pay | Admitting: Oncology

## 2012-08-25 ENCOUNTER — Telehealth: Payer: Self-pay | Admitting: Oncology

## 2012-08-25 ENCOUNTER — Other Ambulatory Visit (HOSPITAL_BASED_OUTPATIENT_CLINIC_OR_DEPARTMENT_OTHER): Payer: BC Managed Care – PPO | Admitting: Lab

## 2012-08-25 ENCOUNTER — Ambulatory Visit (HOSPITAL_BASED_OUTPATIENT_CLINIC_OR_DEPARTMENT_OTHER): Payer: BC Managed Care – PPO | Admitting: Oncology

## 2012-08-25 ENCOUNTER — Ambulatory Visit: Payer: BC Managed Care – PPO

## 2012-08-25 VITALS — BP 108/60 | HR 64 | Temp 97.2°F | Resp 20 | Ht 61.0 in | Wt 148.5 lb

## 2012-08-25 DIAGNOSIS — D6959 Other secondary thrombocytopenia: Secondary | ICD-10-CM

## 2012-08-25 DIAGNOSIS — C259 Malignant neoplasm of pancreas, unspecified: Secondary | ICD-10-CM

## 2012-08-25 DIAGNOSIS — D702 Other drug-induced agranulocytosis: Secondary | ICD-10-CM

## 2012-08-25 DIAGNOSIS — C251 Malignant neoplasm of body of pancreas: Secondary | ICD-10-CM

## 2012-08-25 DIAGNOSIS — C3410 Malignant neoplasm of upper lobe, unspecified bronchus or lung: Secondary | ICD-10-CM

## 2012-08-25 LAB — CBC WITH DIFFERENTIAL/PLATELET
BASO%: 0.5 % (ref 0.0–2.0)
EOS%: 2.6 % (ref 0.0–7.0)
MCH: 32.2 pg (ref 25.1–34.0)
MCHC: 32.9 g/dL (ref 31.5–36.0)
RBC: 3.2 10*6/uL — ABNORMAL LOW (ref 3.70–5.45)
RDW: 16.2 % — ABNORMAL HIGH (ref 11.2–14.5)
lymph#: 0.5 10*3/uL — ABNORMAL LOW (ref 0.9–3.3)
nRBC: 0 % (ref 0–0)

## 2012-08-25 NOTE — Progress Notes (Signed)
[facility]    OFFICE PROGRESS NOTE   INTERVAL HISTORY:   She completed a first treatment with gemcitabine/Abraxane along the 29th 2013. She report a low-grade fever and chills a few days after chemotherapy. She also had a few episodes of diarrhea following chemotherapy. The pain is under better control with the increased dose of OxyContin. She continues to have pain at the left upper abdomen and left lower back.  The large hemorrhoid has improved but she has developed new hemorrhoids at the left side of the anus. She has noted "tingling" in the toes.  Objective:  Vital signs in last 24 hours:  Blood pressure 108/60, pulse 64, temperature 97.2 F (36.2 C), temperature source Oral, resp. rate 20, height 5\' 1"  (1.549 m), weight 148 lb 8 oz (67.359 kg).    HEENT: No thrush or ulcers Resp: Lungs clear bilaterally Cardio: Regular rate and rhythm GI: Mild tenderness in the left upper abdomen, no mass Vascular: No leg edema Rectal: The external hemorrhoid at the right anal verge is smaller. There are soft small left sided external hemorrhoids    Portacath/PICC-without erythema  Lab Results:  Lab Results  Component Value Date   WBC 2.0* 08/25/2012   HGB 10.3* 08/25/2012   HCT 31.3* 08/25/2012   MCV 97.8 08/25/2012   PLT 101* 08/25/2012   ANC 0.8   Medications: I have reviewed the patient's current medications.  Assessment/Plan: 1. Locally advanced pancreas cancer, adenosquamous carcinoma, presenting with a pancreas body mass and CT/ultrasound evidence of portal vein/SMV invasion. She completed cycle 1 of FOLFOX 03/10/2012. She completed cycle 5 on 05/19/2012. CA 19-9 was improved on 05/19/2012. Restaging CT scan 05/31/2012 showed a decrease in the size of the pancreatic mass. She completed cycle 8 FOLFOX on 07/07/2012 and cycle 9 on 07/28/2012. A restaging CT 08/09/2012 confirmed disease progression involving retroperitoneal nodules.    -Initiation of  gemcitabine/Abraxane on 08/11/2012 2. Delayed nausea following cycle 1 of FOLFOX. She receives Aloxi premedication. She takes prophylactic dexamethasone 8 mg twice daily for 2 days beginning day 2. No nausea following the most recent cycle of FOLFOX 3. Pain secondary to the locally advanced pancreas mass. The pain has worsened over the past month. We increase the OxyContin to 40 mg twice daily.  4. Small cell lung cancer. Left perihilar/lung mass was stable on chest x-ray 04/05/2012 and on CT 05/31/2012. She completed radiation to the left chest mass on 06/29/2012 through 07/13/2012. The mass is markedly decreased on the CT 08/09/2012 5. Staging PET scan 02/12/2012 confirming a hypermetabolic left lung mass, hypermetabolic pancreas mass, hypermetabolic peritoneal implant. Status post bronchoscopy confirming a left endobronchial mass and left hilar mass on 02/24/2012. Biopsy of both areas confirmed small cell carcinoma. 6. Baseline nausea. Question related to tumor abutting the stomach. Improved. 7. Irregular bowel habits secondary to chemotherapy and narcotics 8. Neutropenia/thrombocytopenia 07/07/2012 secondary to chemotherapy. She received Neulasta with cycle 8 and 9 FOLFOX. 9. Question early oxaliplatin neuropathy. 10. Right External hemorrhoid-improved 11. Neutropenia/thrombocytopenia following gemcitabine/Abraxane, chemotherapy was held on 08/18/2012. The chemotherapy will be held again today. The gemcitabine and Abraxane will be dose reduced with the next planned chemotherapy on 09/01/2012. Chemotherapy will be switched to an every 2 week schedule. Disposition:  Her overall status appears stable. Chemotherapy will be held today secondary to neutropenia. She will return for an office visit and a second treatment with gemcitabine/Abraxane on 09/01/2012. She will be scheduled for an office visit and chemotherapy on 09/22/2012 to  [facility],  Jillyn Hidden, MD  08/25/2012  9:04 PM

## 2012-08-25 NOTE — Patient Instructions (Signed)
[facility] Discharge Instructions  Your exam findings, labs and results were discussed with your MD today.   Please visit scheduling to obtain calendar for future appointments.  Please call the [facility] at [phone] during business hours should you have any further questions or need assistance in obtaining follow-up care. If you have a medical emergency, please dial 911.  Special Instructions:         

## 2012-08-25 NOTE — Telephone Encounter (Signed)
Gave pt appt for September lab and chemo then October lab, ML and chemo

## 2012-08-31 ENCOUNTER — Telehealth: Payer: Self-pay | Admitting: *Deleted

## 2012-08-31 NOTE — Telephone Encounter (Signed)
Patient having trouble with a tooth that has had root canal in past and is going to the endodontist on 08/31/12. Asking if any precautions need to be taken? Per Dr. Lurlean Leyden let MD know that her platelets are low and she is higher risk for bleeding.

## 2012-09-01 ENCOUNTER — Other Ambulatory Visit: Payer: Self-pay | Admitting: *Deleted

## 2012-09-01 ENCOUNTER — Ambulatory Visit (HOSPITAL_BASED_OUTPATIENT_CLINIC_OR_DEPARTMENT_OTHER): Payer: BC Managed Care – PPO

## 2012-09-01 ENCOUNTER — Other Ambulatory Visit (HOSPITAL_BASED_OUTPATIENT_CLINIC_OR_DEPARTMENT_OTHER): Payer: BC Managed Care – PPO | Admitting: Lab

## 2012-09-01 VITALS — BP 126/75 | HR 63 | Temp 98.2°F | Resp 20

## 2012-09-01 DIAGNOSIS — Z5111 Encounter for antineoplastic chemotherapy: Secondary | ICD-10-CM

## 2012-09-01 DIAGNOSIS — C259 Malignant neoplasm of pancreas, unspecified: Secondary | ICD-10-CM

## 2012-09-01 DIAGNOSIS — C251 Malignant neoplasm of body of pancreas: Secondary | ICD-10-CM

## 2012-09-01 DIAGNOSIS — C3430 Malignant neoplasm of lower lobe, unspecified bronchus or lung: Secondary | ICD-10-CM

## 2012-09-01 LAB — CBC WITH DIFFERENTIAL/PLATELET
BASO%: 0.5 % (ref 0.0–2.0)
EOS%: 3.6 % (ref 0.0–7.0)
HCT: 34.1 % — ABNORMAL LOW (ref 34.8–46.6)
LYMPH%: 18.1 % (ref 14.0–49.7)
MCH: 32.6 pg (ref 25.1–34.0)
MCHC: 33.4 g/dL (ref 31.5–36.0)
MCV: 97.4 fL (ref 79.5–101.0)
MONO%: 19.7 % — ABNORMAL HIGH (ref 0.0–14.0)
NEUT%: 58.1 % (ref 38.4–76.8)
Platelets: 138 10*3/uL — ABNORMAL LOW (ref 145–400)
RBC: 3.5 10*6/uL — ABNORMAL LOW (ref 3.70–5.45)

## 2012-09-01 MED ORDER — SODIUM CHLORIDE 0.9 % IV SOLN
500.0000 mg/m2 | Freq: Once | INTRAVENOUS | Status: AC
  Administered 2012-09-01: 836 mg via INTRAVENOUS
  Filled 2012-09-01: qty 22

## 2012-09-01 MED ORDER — HEPARIN SOD (PORK) LOCK FLUSH 100 UNIT/ML IV SOLN
500.0000 [IU] | Freq: Once | INTRAVENOUS | Status: AC | PRN
  Administered 2012-09-01: 500 [IU]
  Filled 2012-09-01: qty 5

## 2012-09-01 MED ORDER — PACLITAXEL PROTEIN-BOUND CHEMO INJECTION 100 MG
80.0000 mg/m2 | Freq: Once | INTRAVENOUS | Status: AC
  Administered 2012-09-01: 125 mg via INTRAVENOUS
  Filled 2012-09-01: qty 25

## 2012-09-01 MED ORDER — SODIUM CHLORIDE 0.9 % IV SOLN
Freq: Once | INTRAVENOUS | Status: AC
  Administered 2012-09-01: 14:00:00 via INTRAVENOUS

## 2012-09-01 MED ORDER — DEXAMETHASONE SODIUM PHOSPHATE 10 MG/ML IJ SOLN
10.0000 mg | Freq: Once | INTRAMUSCULAR | Status: AC
  Administered 2012-09-01: 10 mg via INTRAVENOUS

## 2012-09-01 MED ORDER — ALTEPLASE 2 MG IJ SOLR
2.0000 mg | Freq: Once | INTRAMUSCULAR | Status: DC | PRN
  Filled 2012-09-01: qty 2

## 2012-09-01 MED ORDER — SODIUM CHLORIDE 0.9 % IJ SOLN
10.0000 mL | INTRAMUSCULAR | Status: DC | PRN
  Administered 2012-09-01: 10 mL
  Filled 2012-09-01: qty 10

## 2012-09-01 MED ORDER — HEPARIN SOD (PORK) LOCK FLUSH 100 UNIT/ML IV SOLN
250.0000 [IU] | Freq: Once | INTRAVENOUS | Status: DC | PRN
  Filled 2012-09-01: qty 5

## 2012-09-01 MED ORDER — ONDANSETRON HCL 8 MG PO TABS: 8.0000 mg | ORAL_TABLET | Freq: Two times a day (BID) | ORAL | Status: DC | PRN

## 2012-09-01 MED ORDER — SODIUM CHLORIDE 0.9 % IJ SOLN
3.0000 mL | INTRAMUSCULAR | Status: DC | PRN
  Filled 2012-09-01: qty 10

## 2012-09-01 MED ORDER — ONDANSETRON 8 MG/50ML IVPB (CHCC)
8.0000 mg | Freq: Once | INTRAVENOUS | Status: AC
  Administered 2012-09-01: 8 mg via INTRAVENOUS

## 2012-09-01 NOTE — Patient Instructions (Addendum)
[facility] Discharge Instructions for Patients Receiving Chemotherapy  Today you received the following chemotherapy agents  To help prevent nausea and vomiting after your treatment, we encourage you to take your nausea medication   Take it as often as prescribed.   If you develop nausea and vomiting that is not controlled by your nausea medication, call the clinic. If it is after clinic hours your family physician or the after hours number for the clinic or go to the Emergency Department.   BELOW ARE SYMPTOMS THAT SHOULD BE REPORTED IMMEDIATELY:  *FEVER GREATER THAN 100.5 F  *CHILLS WITH OR WITHOUT FEVER  NAUSEA AND VOMITING THAT IS NOT CONTROLLED WITH YOUR NAUSEA MEDICATION  *UNUSUAL SHORTNESS OF BREATH  *UNUSUAL BRUISING OR BLEEDING  TENDERNESS IN MOUTH AND THROAT WITH OR WITHOUT PRESENCE OF ULCERS  *URINARY PROBLEMS  *BOWEL PROBLEMS  UNUSUAL RASH Items with * indicate a potential emergency and should be followed up as soon as possible.  If this is your first treatment one of the nurses will contact you 24 hours after your treatment. Please let the nurse know about any problems that you may have experienced. Feel free to call the clinic you have any questions or concerns. The clinic phone number is [phone].

## 2012-09-06 ENCOUNTER — Other Ambulatory Visit: Payer: Self-pay | Admitting: Nurse Practitioner

## 2012-09-06 MED ORDER — HYDROCORTISONE ACETATE 25 MG RE SUPP: 25.0000 mg | Freq: Two times a day (BID) | RECTAL | Status: DC

## 2012-09-14 ENCOUNTER — Other Ambulatory Visit: Payer: Self-pay | Admitting: Oncology

## 2012-09-15 ENCOUNTER — Telehealth: Payer: Self-pay | Admitting: Oncology

## 2012-09-15 ENCOUNTER — Ambulatory Visit (HOSPITAL_BASED_OUTPATIENT_CLINIC_OR_DEPARTMENT_OTHER): Payer: BC Managed Care – PPO | Admitting: Nurse Practitioner

## 2012-09-15 ENCOUNTER — Ambulatory Visit (HOSPITAL_BASED_OUTPATIENT_CLINIC_OR_DEPARTMENT_OTHER): Payer: BC Managed Care – PPO

## 2012-09-15 ENCOUNTER — Other Ambulatory Visit: Payer: BC Managed Care – PPO | Admitting: Lab

## 2012-09-15 ENCOUNTER — Telehealth: Payer: Self-pay | Admitting: *Deleted

## 2012-09-15 VITALS — BP 119/64 | HR 66 | Temp 97.4°F | Resp 20 | Ht 61.0 in | Wt 138.4 lb

## 2012-09-15 DIAGNOSIS — C251 Malignant neoplasm of body of pancreas: Secondary | ICD-10-CM

## 2012-09-15 DIAGNOSIS — C259 Malignant neoplasm of pancreas, unspecified: Secondary | ICD-10-CM

## 2012-09-15 DIAGNOSIS — C3490 Malignant neoplasm of unspecified part of unspecified bronchus or lung: Secondary | ICD-10-CM

## 2012-09-15 DIAGNOSIS — C3410 Malignant neoplasm of upper lobe, unspecified bronchus or lung: Secondary | ICD-10-CM

## 2012-09-15 DIAGNOSIS — D702 Other drug-induced agranulocytosis: Secondary | ICD-10-CM

## 2012-09-15 DIAGNOSIS — Z5111 Encounter for antineoplastic chemotherapy: Secondary | ICD-10-CM

## 2012-09-15 LAB — CBC WITH DIFFERENTIAL/PLATELET
BASO%: 0.6 % (ref 0.0–2.0)
EOS%: 2.6 % (ref 0.0–7.0)
LYMPH%: 18.3 % (ref 14.0–49.7)
MCH: 32.1 pg (ref 25.1–34.0)
MCHC: 33.4 g/dL (ref 31.5–36.0)
MCV: 96 fL (ref 79.5–101.0)
MONO%: 19 % — ABNORMAL HIGH (ref 0.0–14.0)
Platelets: 98 10*3/uL — ABNORMAL LOW (ref 145–400)
RBC: 3.24 10*6/uL — ABNORMAL LOW (ref 3.70–5.45)
nRBC: 0 % (ref 0–0)

## 2012-09-15 LAB — COMPREHENSIVE METABOLIC PANEL (CC13)
ALT: 38 U/L (ref 0–55)
Alkaline Phosphatase: 210 U/L — ABNORMAL HIGH (ref 40–150)
CO2: 25 mEq/L (ref 22–29)
Potassium: 3.6 mEq/L (ref 3.5–5.1)
Sodium: 137 mEq/L (ref 136–145)
Total Bilirubin: 0.6 mg/dL (ref 0.20–1.20)
Total Protein: 5.9 g/dL — ABNORMAL LOW (ref 6.4–8.3)

## 2012-09-15 MED ORDER — PACLITAXEL PROTEIN-BOUND CHEMO INJECTION 100 MG
80.0000 mg/m2 | Freq: Once | INTRAVENOUS | Status: AC
  Administered 2012-09-15: 125 mg via INTRAVENOUS
  Filled 2012-09-15: qty 25

## 2012-09-15 MED ORDER — DEXAMETHASONE SODIUM PHOSPHATE 10 MG/ML IJ SOLN
10.0000 mg | Freq: Once | INTRAMUSCULAR | Status: AC
  Administered 2012-09-15: 10 mg via INTRAVENOUS

## 2012-09-15 MED ORDER — HEPARIN SOD (PORK) LOCK FLUSH 100 UNIT/ML IV SOLN
500.0000 [IU] | Freq: Once | INTRAVENOUS | Status: AC | PRN
  Administered 2012-09-15: 500 [IU]
  Filled 2012-09-15: qty 5

## 2012-09-15 MED ORDER — ONDANSETRON 8 MG/50ML IVPB (CHCC)
8.0000 mg | Freq: Once | INTRAVENOUS | Status: AC
  Administered 2012-09-15: 8 mg via INTRAVENOUS

## 2012-09-15 MED ORDER — SODIUM CHLORIDE 0.9 % IV SOLN
Freq: Once | INTRAVENOUS | Status: AC
  Administered 2012-09-15: 11:00:00 via INTRAVENOUS

## 2012-09-15 MED ORDER — SODIUM CHLORIDE 0.9 % IJ SOLN
10.0000 mL | INTRAMUSCULAR | Status: DC | PRN
  Administered 2012-09-15: 10 mL
  Filled 2012-09-15: qty 10

## 2012-09-15 MED ORDER — SODIUM CHLORIDE 0.9 % IV SOLN
500.0000 mg/m2 | Freq: Once | INTRAVENOUS | Status: AC
  Administered 2012-09-15: 836 mg via INTRAVENOUS
  Filled 2012-09-15: qty 22

## 2012-09-15 MED ORDER — ALPRAZOLAM 0.5 MG PO TABS: ORAL_TABLET | ORAL | Status: DC

## 2012-09-15 NOTE — Patient Instructions (Addendum)
[facility] Center Discharge Instructions for Patients Receiving Chemotherapy  Today you received the following chemotherapy agents Gemzar and Abraxane.  To help prevent nausea and vomiting after your treatment, we encourage you to take your nausea medication as ordered per MD.    If you develop nausea and vomiting that is not controlled by your nausea medication, call the clinic. If it is after clinic hours your family physician or the after hours number for the clinic or go to the Emergency Department.   BELOW ARE SYMPTOMS THAT SHOULD BE REPORTED IMMEDIATELY:  *FEVER GREATER THAN 100.5 F  *CHILLS WITH OR WITHOUT FEVER  NAUSEA AND VOMITING THAT IS NOT CONTROLLED WITH YOUR NAUSEA MEDICATION  *UNUSUAL SHORTNESS OF BREATH  *UNUSUAL BRUISING OR BLEEDING  TENDERNESS IN MOUTH AND THROAT WITH OR WITHOUT PRESENCE OF ULCERS  *URINARY PROBLEMS  *BOWEL PROBLEMS  UNUSUAL RASH Items with * indicate a potential emergency and should be followed up as soon as possible.   Please let the nurse know about any problems that you may have experienced. Feel free to call the clinic you have any questions or concerns. The clinic phone number is [phone].   I have been informed and understand all the instructions given to me. I know to contact the clinic, my physician, or go to the Emergency Department if any problems should occur. I do not have any questions at this time, but understand that I may call the clinic during office hours   should I have any questions or need assistance in obtaining follow up care.    __________________________________________  _____________  __________ Signature of Patient or Authorized Representative            Date                   Time    __________________________________________ Nurse's Signature    

## 2012-09-15 NOTE — Progress Notes (Signed)
Per dictation by Lonna Cobb, okay to proceed with tx today as scheduled.  SLJ

## 2012-09-15 NOTE — Telephone Encounter (Signed)
gv and printed appt for pt..emailed Michelle to add chemos for OCT-NOV.Marland Kitchenthe patient is aware that the chemos will be same day after lab and MD visit....Marland Kitchensed

## 2012-09-15 NOTE — Progress Notes (Signed)
OFFICE PROGRESS NOTE  Interval history:  Victoria Price returns as scheduled. She completed a second treatment with gemcitabine/Abraxane on 09/01/2012. She has noted increased nausea since treatment. No vomiting. Zofran effectively relieves the nausea. She woke up early this morning with loose stools. Prior to that she was constipated. She increase the fiber in her diet and took a laxative yesterday. She denies mouth sores. No fever. Feet "tingle" intermittently. Left-sided abdominal pain is stable overall. She has recently noted right-sided abdominal pain. She attributes this pain to beginning a new exercise regimen.   Objective: Blood pressure 119/64, pulse 66, temperature 97.4 F (36.3 C), temperature source Oral, resp. rate 20, height 5\' 1"  (1.549 m), weight 138 lb 6.4 oz (62.778 kg).  Oropharynx is without thrush or ulceration. Lungs are clear. Regular cardiac rhythm. Port-A-Cath site is without erythema. Abdomen is soft. Tender over the left upper abdomen. No mass. No hepatomegaly. Extremities are without edema.  Lab Results: Lab Results  Component Value Date   WBC 3.1* 09/15/2012   HGB 10.4* 09/15/2012   HCT 31.1* 09/15/2012   MCV 96.0 09/15/2012   PLT 98* 09/15/2012    Chemistry:    Chemistry      Component Value Date/Time   NA 137 08/11/2012 1226   NA 134* 07/28/2012 0945   K 3.4* 08/11/2012 1226   K 3.5 07/28/2012 0945   CL 101 08/11/2012 1226   CL 97 07/28/2012 0945   CO2 26 08/11/2012 1226   CO2 28 07/28/2012 0945   BUN 9.0 08/11/2012 1226   BUN 6 07/28/2012 0945   CREATININE 0.8 08/11/2012 1226   CREATININE 0.57 07/28/2012 0945      Component Value Date/Time   CALCIUM 9.1 07/28/2012 0945   ALKPHOS 221* 08/11/2012 1226   ALKPHOS 257* 07/28/2012 0945   AST 29 08/11/2012 1226   AST 30 07/28/2012 0945   ALT 30 08/11/2012 1226   ALT 20 07/28/2012 0945   BILITOT 0.50 08/11/2012 1226   BILITOT 0.4 07/28/2012 0945       Studies/Results: No results found.  Medications: I have reviewed  the patient's current medications.  Assessment/Plan:  1. Locally advanced pancreas cancer, adenosquamous carcinoma, presenting with a pancreas body mass and CT/ultrasound evidence of portal vein/SMV invasion. She completed cycle 1 of FOLFOX 03/10/2012. She completed cycle 5 on 05/19/2012. CA 19-9 was improved on 05/19/2012. Restaging CT scan 05/31/2012 showed a decrease in the size of the pancreatic mass. She completed cycle 8 FOLFOX on 07/07/2012 and cycle 9 on 07/28/2012. A restaging CT 08/09/2012 confirmed disease progression involving retroperitoneal nodules. Initiation of gemcitabine/Abraxane on 08/11/2012. 2. Delayed nausea following cycle 1 of FOLFOX. She received Aloxi and took prophylactic Decadron. 3. Pain secondary to the locally advanced pancreas mass.  She continues OxyContin 40 mg twice daily.  4. Small cell lung cancer. Left perihilar/lung mass was stable on chest x-ray 04/05/2012 and on CT 05/31/2012. She completed radiation to the left chest mass on 06/29/2012 through 07/13/2012. The mass was markedly decreased on the CT 08/09/2012. 5. Staging PET scan 02/12/2012 confirming a hypermetabolic left lung mass, hypermetabolic pancreas mass, hypermetabolic peritoneal implant. Status post bronchoscopy confirming a left endobronchial mass and left hilar mass on 02/24/2012. Biopsy of both areas confirmed small cell carcinoma. 6. Baseline nausea. Question related to tumor abutting the stomach.  7. Irregular bowel habits secondary to chemotherapy and narcotics. 8. Neutropenia/thrombocytopenia 07/07/2012 secondary to chemotherapy. She received Neulasta with cycle 8 and 9 FOLFOX. 9. Question early oxaliplatin neuropathy. 10. Right  External hemorrhoid-improved on 08/25/2012. 11. Neutropenia/thrombocytopenia following gemcitabine/Abraxane, chemotherapy was held on 08/18/2012. The chemotherapy was held again 08/25/2012. The gemcitabine and Abraxane were dose reduced beginning 09/01/2012. Chemotherapy  was switched to an every 2 week schedule.  Disposition-Ms. Heinrichs appears stable overall. Plan to proceed with the third treatment with gemcitabine/Abraxane today as scheduled. She will return for a followup visit and treatment 4 in 2 weeks. We will repeat the CA 19-9 tumor marker in 2 weeks. She will contact the office prior to her next visit with any problems.  Plan reviewed with Dr. Truett Perna.  Lonna Cobb ANP/GNP-BC

## 2012-09-15 NOTE — Telephone Encounter (Signed)
Per staff message and POF I have scheduled appts.  JMW  

## 2012-09-15 NOTE — Telephone Encounter (Signed)
tx added for 10/17 and 10/31. Checked w/LT and although 10/10 appears in care plan per LT this tx is not needed pt is now tx'd q2w.

## 2012-09-26 ENCOUNTER — Other Ambulatory Visit: Payer: Self-pay | Admitting: *Deleted

## 2012-09-27 ENCOUNTER — Other Ambulatory Visit: Payer: Self-pay | Admitting: *Deleted

## 2012-09-27 DIAGNOSIS — C259 Malignant neoplasm of pancreas, unspecified: Secondary | ICD-10-CM

## 2012-09-27 MED ORDER — OXYCODONE HCL 5 MG PO TABS: ORAL_TABLET | ORAL | Status: DC

## 2012-09-27 NOTE — Telephone Encounter (Signed)
Patient called to request to pick up her pain med script today.

## 2012-09-28 ENCOUNTER — Other Ambulatory Visit: Payer: Self-pay | Admitting: Oncology

## 2012-09-29 ENCOUNTER — Other Ambulatory Visit (HOSPITAL_BASED_OUTPATIENT_CLINIC_OR_DEPARTMENT_OTHER): Payer: BC Managed Care – PPO | Admitting: Lab

## 2012-09-29 ENCOUNTER — Ambulatory Visit (HOSPITAL_BASED_OUTPATIENT_CLINIC_OR_DEPARTMENT_OTHER): Payer: BC Managed Care – PPO

## 2012-09-29 ENCOUNTER — Ambulatory Visit (HOSPITAL_BASED_OUTPATIENT_CLINIC_OR_DEPARTMENT_OTHER): Payer: BC Managed Care – PPO | Admitting: Nurse Practitioner

## 2012-09-29 VITALS — BP 136/68 | HR 61 | Temp 97.1°F | Resp 20 | Ht 61.0 in | Wt 136.3 lb

## 2012-09-29 DIAGNOSIS — C3490 Malignant neoplasm of unspecified part of unspecified bronchus or lung: Secondary | ICD-10-CM

## 2012-09-29 DIAGNOSIS — C3410 Malignant neoplasm of upper lobe, unspecified bronchus or lung: Secondary | ICD-10-CM

## 2012-09-29 DIAGNOSIS — Z5111 Encounter for antineoplastic chemotherapy: Secondary | ICD-10-CM

## 2012-09-29 DIAGNOSIS — C259 Malignant neoplasm of pancreas, unspecified: Secondary | ICD-10-CM

## 2012-09-29 DIAGNOSIS — C251 Malignant neoplasm of body of pancreas: Secondary | ICD-10-CM

## 2012-09-29 DIAGNOSIS — D6959 Other secondary thrombocytopenia: Secondary | ICD-10-CM

## 2012-09-29 DIAGNOSIS — D702 Other drug-induced agranulocytosis: Secondary | ICD-10-CM

## 2012-09-29 LAB — CBC WITH DIFFERENTIAL/PLATELET
BASO%: 0.3 % (ref 0.0–2.0)
EOS%: 1.7 % (ref 0.0–7.0)
Eosinophils Absolute: 0.1 10*3/uL (ref 0.0–0.5)
LYMPH%: 23.5 % (ref 14.0–49.7)
MCHC: 33.2 g/dL (ref 31.5–36.0)
MCV: 94.8 fL (ref 79.5–101.0)
MONO%: 23.8 % — ABNORMAL HIGH (ref 0.0–14.0)
NEUT#: 1.5 10*3/uL (ref 1.5–6.5)
RBC: 3.3 10*6/uL — ABNORMAL LOW (ref 3.70–5.45)
RDW: 14.7 % — ABNORMAL HIGH (ref 11.2–14.5)
nRBC: 0 % (ref 0–0)

## 2012-09-29 LAB — COMPREHENSIVE METABOLIC PANEL (CC13)
ALT: 33 U/L (ref 0–55)
AST: 40 U/L — ABNORMAL HIGH (ref 5–34)
Alkaline Phosphatase: 259 U/L — ABNORMAL HIGH (ref 40–150)
CO2: 25 mEq/L (ref 22–29)
Creatinine: 0.7 mg/dL (ref 0.6–1.1)
Sodium: 137 mEq/L (ref 136–145)
Total Bilirubin: 0.7 mg/dL (ref 0.20–1.20)
Total Protein: 6 g/dL — ABNORMAL LOW (ref 6.4–8.3)

## 2012-09-29 MED ORDER — SODIUM CHLORIDE 0.9 % IJ SOLN
10.0000 mL | INTRAMUSCULAR | Status: DC | PRN
  Administered 2012-09-29: 10 mL
  Filled 2012-09-29: qty 10

## 2012-09-29 MED ORDER — PALONOSETRON HCL INJECTION 0.25 MG/5ML
0.2500 mg | Freq: Once | INTRAVENOUS | Status: AC
  Administered 2012-09-29: 0.25 mg via INTRAVENOUS

## 2012-09-29 MED ORDER — DEXAMETHASONE SODIUM PHOSPHATE 10 MG/ML IJ SOLN
10.0000 mg | Freq: Once | INTRAMUSCULAR | Status: AC
  Administered 2012-09-29: 10 mg via INTRAVENOUS

## 2012-09-29 MED ORDER — PACLITAXEL PROTEIN-BOUND CHEMO INJECTION 100 MG
80.0000 mg/m2 | Freq: Once | INTRAVENOUS | Status: AC
  Administered 2012-09-29: 125 mg via INTRAVENOUS
  Filled 2012-09-29: qty 25

## 2012-09-29 MED ORDER — HEPARIN SOD (PORK) LOCK FLUSH 100 UNIT/ML IV SOLN
500.0000 [IU] | Freq: Once | INTRAVENOUS | Status: AC | PRN
  Administered 2012-09-29: 500 [IU]
  Filled 2012-09-29: qty 5

## 2012-09-29 MED ORDER — SODIUM CHLORIDE 0.9 % IV SOLN
Freq: Once | INTRAVENOUS | Status: AC
  Administered 2012-09-29: 11:00:00 via INTRAVENOUS

## 2012-09-29 MED ORDER — SODIUM CHLORIDE 0.9 % IV SOLN
500.0000 mg/m2 | Freq: Once | INTRAVENOUS | Status: AC
  Administered 2012-09-29: 836 mg via INTRAVENOUS
  Filled 2012-09-29: qty 22

## 2012-09-29 NOTE — Patient Instructions (Signed)
[facility] Discharge Instructions for Patients Receiving Chemotherapy  Today you received the following chemotherapy agents Gemzar and Abraxane.  To help prevent nausea and vomiting after your treatment, we encourage you to take your nausea medication.   If you develop nausea and vomiting that is not controlled by your nausea medication, call the clinic. If it is after clinic hours your family physician or the after hours number for the clinic or go to the Emergency Department.   BELOW ARE SYMPTOMS THAT SHOULD BE REPORTED IMMEDIATELY:  *FEVER GREATER THAN 100.5 F  *CHILLS WITH OR WITHOUT FEVER  NAUSEA AND VOMITING THAT IS NOT CONTROLLED WITH YOUR NAUSEA MEDICATION  *UNUSUAL SHORTNESS OF BREATH  *UNUSUAL BRUISING OR BLEEDING  TENDERNESS IN MOUTH AND THROAT WITH OR WITHOUT PRESENCE OF ULCERS  *URINARY PROBLEMS  *BOWEL PROBLEMS  UNUSUAL RASH Items with * indicate a potential emergency and should be followed up as soon as possible.  One of the nurses will contact you 24 hours after your treatment. Please let the nurse know about any problems that you may have experienced. Feel free to call the clinic you have any questions or concerns. The clinic phone number is [phone].   I have been informed and understand all the instructions given to me. I know to contact the clinic, my physician, or go to the Emergency Department if any problems should occur. I do not have any questions at this time, but understand that I may call the clinic during office hours   should I have any questions or need assistance in obtaining follow up care.    __________________________________________  _____________  __________ Signature of Patient or Authorized Representative            Date                   Time    __________________________________________ Nurse's Signature    

## 2012-09-29 NOTE — Progress Notes (Signed)
OFFICE PROGRESS NOTE  Interval history:  Victoria Price returns as scheduled. She completed cycle 3 gemcitabine/Abraxane on 09/15/2012. She noted increased nausea on day 2. No vomiting. She has noted increased constipation since beginning the gemcitabine/Abraxane. She thinks the constipation is causing generalized abdominal discomfort. She takes MiraLAX daily. Left-sided abdominal pain is stable. She notes intermittent tingling in the toes. This does not interfere with activity.   Objective: Blood pressure 136/68, pulse 61, temperature 97.1 F (36.2 C), temperature source Oral, resp. rate 20, height 5\' 1"  (1.549 m), weight 136 lb 4.8 oz (61.825 kg).  Oropharynx is without thrush or ulceration. Lungs are clear. Regular cardiac rhythm. Port-A-Cath site is without erythema. Abdomen is soft with mild generalized tenderness. No organomegaly. Extremities are without edema. Calves are soft and nontender. Vibratory sense is intact over the fingertips per tuning fork exam. Vibratory sense is mildly to moderately decreased over the toes per tuning fork exam.  Lab Results: Lab Results  Component Value Date   WBC 3.0* 09/29/2012   HGB 10.4* 09/29/2012   HCT 31.3* 09/29/2012   MCV 94.8 09/29/2012   PLT 120* 09/29/2012    Chemistry:    Chemistry      Component Value Date/Time   NA 137 09/15/2012 0845   NA 134* 07/28/2012 0945   K 3.6 09/15/2012 0845   K 3.5 07/28/2012 0945   CL 101 09/15/2012 0845   CL 97 07/28/2012 0945   CO2 25 09/15/2012 0845   CO2 28 07/28/2012 0945   BUN 4.0* 09/15/2012 0845   BUN 6 07/28/2012 0945   CREATININE 0.7 09/15/2012 0845   CREATININE 0.57 07/28/2012 0945      Component Value Date/Time   CALCIUM 9.0 09/15/2012 0845   CALCIUM 9.1 07/28/2012 0945   ALKPHOS 210* 09/15/2012 0845   ALKPHOS 257* 07/28/2012 0945   AST 44* 09/15/2012 0845   AST 30 07/28/2012 0945   ALT 38 09/15/2012 0845   ALT 20 07/28/2012 0945   BILITOT 0.60 09/15/2012 0845   BILITOT 0.4 07/28/2012 0945        Studies/Results: No results found.  Medications: I have reviewed the patient's current medications.  Assessment/Plan:  1. Locally advanced pancreas cancer, adenosquamous carcinoma, presenting with a pancreas body mass and CT/ultrasound evidence of portal vein/SMV invasion. She completed cycle 1 of FOLFOX 03/10/2012. She completed cycle 5 on 05/19/2012. CA 19-9 was improved on 05/19/2012. Restaging CT scan 05/31/2012 showed a decrease in the size of the pancreatic mass. She completed cycle 8 FOLFOX on 07/07/2012 and cycle 9 on 07/28/2012. A restaging CT 08/09/2012 confirmed disease progression involving retroperitoneal nodules. Initiation of gemcitabine/Abraxane on 08/11/2012. She completed cycle 3 on 09/15/2012. 2. Delayed nausea following cycle 1 of FOLFOX. She received Aloxi and took prophylactic Decadron. 3. Pain secondary to the locally advanced pancreas mass. She continues OxyContin 40 mg twice daily.  4. Small cell lung cancer. Left perihilar/lung mass was stable on chest x-ray 04/05/2012 and on CT 05/31/2012. She completed radiation to the left chest mass on 06/29/2012 through 07/13/2012. The mass was markedly decreased on the CT 08/09/2012. 5. Staging PET scan 02/12/2012 confirming a hypermetabolic left lung mass, hypermetabolic pancreas mass, hypermetabolic peritoneal implant. Status post bronchoscopy confirming a left endobronchial mass and left hilar mass on 02/24/2012. Biopsy of both areas confirmed small cell carcinoma. 6. Baseline nausea. Question related to tumor abutting the stomach.  7. Irregular bowel habits secondary to chemotherapy and narcotics. She has noted increased constipation since beginning the gemcitabine/Abraxane. She will  continue daily MiraLAX and begin Senokot-S 2 tablets twice daily. 8. Neutropenia/thrombocytopenia 07/07/2012 secondary to chemotherapy. She received Neulasta with cycle 8 and 9 FOLFOX. 9. 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 May also be related to Abraxane. 10. Right external hemorrhoid-improved on 08/25/2012. 11. Neutropenia/thrombocytopenia following gemcitabine/Abraxane, chemotherapy was held on 08/18/2012. The chemotherapy was held again 08/25/2012. The gemcitabine and Abraxane were dose reduced beginning 09/01/2012. Chemotherapy was switched to an every 2 week schedule. 12. Delayed nausea following cycle 3 gemcitabine/Abraxane. Aloxi will be substituted for Zofran with cycle 4.  Disposition-Ms. Elson appears stable. Plan to proceed with cycle 4 gemcitabine/Abraxane today as scheduled. We will followup on the CA 19-9 tumor marker from today. She will return for a followup visit in 2 weeks. She will contact the office in the interim with any problems.  Lonna Cobb ANP/GNP-BC

## 2012-09-30 ENCOUNTER — Telehealth: Payer: Self-pay | Admitting: *Deleted

## 2012-09-30 NOTE — Telephone Encounter (Signed)
Reports pain in her left outer thigh past couple days-aches. Has been using heating pad and topical arthritis cream. Denies any swelling or discoloration in leg. Has just started weekly tia chi on Wednesdays. Instructed patient to rest, elevate leg. Call for any swelling in leg. Probably strain from exercise, but she is a risk for DVT.

## 2012-10-12 ENCOUNTER — Other Ambulatory Visit: Payer: Self-pay | Admitting: Oncology

## 2012-10-13 ENCOUNTER — Other Ambulatory Visit (HOSPITAL_BASED_OUTPATIENT_CLINIC_OR_DEPARTMENT_OTHER): Payer: BC Managed Care – PPO | Admitting: Lab

## 2012-10-13 ENCOUNTER — Ambulatory Visit (HOSPITAL_BASED_OUTPATIENT_CLINIC_OR_DEPARTMENT_OTHER): Payer: BC Managed Care – PPO | Admitting: Oncology

## 2012-10-13 ENCOUNTER — Ambulatory Visit (HOSPITAL_BASED_OUTPATIENT_CLINIC_OR_DEPARTMENT_OTHER): Payer: BC Managed Care – PPO

## 2012-10-13 ENCOUNTER — Telehealth: Payer: Self-pay | Admitting: Oncology

## 2012-10-13 VITALS — BP 132/70 | HR 63 | Temp 97.9°F | Resp 20 | Ht 61.0 in | Wt 135.6 lb

## 2012-10-13 DIAGNOSIS — D702 Other drug-induced agranulocytosis: Secondary | ICD-10-CM

## 2012-10-13 DIAGNOSIS — C259 Malignant neoplasm of pancreas, unspecified: Secondary | ICD-10-CM

## 2012-10-13 DIAGNOSIS — C3410 Malignant neoplasm of upper lobe, unspecified bronchus or lung: Secondary | ICD-10-CM

## 2012-10-13 DIAGNOSIS — C251 Malignant neoplasm of body of pancreas: Secondary | ICD-10-CM

## 2012-10-13 DIAGNOSIS — Z5111 Encounter for antineoplastic chemotherapy: Secondary | ICD-10-CM

## 2012-10-13 DIAGNOSIS — D6959 Other secondary thrombocytopenia: Secondary | ICD-10-CM

## 2012-10-13 DIAGNOSIS — C3490 Malignant neoplasm of unspecified part of unspecified bronchus or lung: Secondary | ICD-10-CM

## 2012-10-13 LAB — CBC WITH DIFFERENTIAL/PLATELET
Basophils Absolute: 0 10*3/uL (ref 0.0–0.1)
EOS%: 3.5 % (ref 0.0–7.0)
Eosinophils Absolute: 0.1 10*3/uL (ref 0.0–0.5)
HGB: 10.1 g/dL — ABNORMAL LOW (ref 11.6–15.9)
MCV: 93.6 fL (ref 79.5–101.0)
MONO%: 34.7 % — ABNORMAL HIGH (ref 0.0–14.0)
NEUT#: 1.2 10*3/uL — ABNORMAL LOW (ref 1.5–6.5)
RBC: 3.3 10*6/uL — ABNORMAL LOW (ref 3.70–5.45)
RDW: 14.7 % — ABNORMAL HIGH (ref 11.2–14.5)
lymph#: 0.7 10*3/uL — ABNORMAL LOW (ref 0.9–3.3)
nRBC: 0 % (ref 0–0)

## 2012-10-13 LAB — COMPREHENSIVE METABOLIC PANEL (CC13)
ALT: 33 U/L (ref 0–55)
Albumin: 3.2 g/dL — ABNORMAL LOW (ref 3.5–5.0)
Alkaline Phosphatase: 356 U/L — ABNORMAL HIGH (ref 40–150)
CO2: 30 mEq/L — ABNORMAL HIGH (ref 22–29)
Potassium: 3.3 mEq/L — ABNORMAL LOW (ref 3.5–5.1)
Sodium: 136 mEq/L (ref 136–145)
Total Bilirubin: 0.68 mg/dL (ref 0.20–1.20)
Total Protein: 5.7 g/dL — ABNORMAL LOW (ref 6.4–8.3)

## 2012-10-13 MED ORDER — HEPARIN SOD (PORK) LOCK FLUSH 100 UNIT/ML IV SOLN
500.0000 [IU] | Freq: Once | INTRAVENOUS | Status: AC | PRN
  Administered 2012-10-13: 500 [IU]
  Filled 2012-10-13: qty 5

## 2012-10-13 MED ORDER — PACLITAXEL PROTEIN-BOUND CHEMO INJECTION 100 MG
80.0000 mg/m2 | Freq: Once | INTRAVENOUS | Status: AC
  Administered 2012-10-13: 125 mg via INTRAVENOUS
  Filled 2012-10-13: qty 25

## 2012-10-13 MED ORDER — DEXAMETHASONE SODIUM PHOSPHATE 10 MG/ML IJ SOLN
10.0000 mg | Freq: Once | INTRAMUSCULAR | Status: AC
  Administered 2012-10-13: 10 mg via INTRAVENOUS

## 2012-10-13 MED ORDER — SODIUM CHLORIDE 0.9 % IV SOLN
500.0000 mg/m2 | Freq: Once | INTRAVENOUS | Status: AC
  Administered 2012-10-13: 836 mg via INTRAVENOUS
  Filled 2012-10-13: qty 22

## 2012-10-13 MED ORDER — PALONOSETRON HCL INJECTION 0.25 MG/5ML
0.2500 mg | Freq: Once | INTRAVENOUS | Status: AC
  Administered 2012-10-13: 0.25 mg via INTRAVENOUS

## 2012-10-13 MED ORDER — OXYCODONE HCL 40 MG PO TB12: 40.0000 mg | ORAL_TABLET | Freq: Two times a day (BID) | ORAL | Status: AC

## 2012-10-13 MED ORDER — SODIUM CHLORIDE 0.9 % IJ SOLN
10.0000 mL | INTRAMUSCULAR | Status: DC | PRN
  Administered 2012-10-13: 10 mL
  Filled 2012-10-13: qty 10

## 2012-10-13 MED ORDER — SODIUM CHLORIDE 0.9 % IV SOLN
Freq: Once | INTRAVENOUS | Status: AC
  Administered 2012-10-13: 11:00:00 via INTRAVENOUS

## 2012-10-13 NOTE — Telephone Encounter (Signed)
appts made and printed for pt aom °

## 2012-10-13 NOTE — Progress Notes (Signed)
   [facility]    OFFICE PROGRESS NOTE   INTERVAL HISTORY:   The abdominal pain has improved.  She now has back pain.  Intermittent constipation.  Increased numbness in the toes.    Objective:  Vital signs in last 24 hours:  Blood pressure 132/70, pulse 63, temperature 97.9 F (36.6 C), temperature source Oral, resp. rate 20, height 5\' 1"  (1.549 m), weight 135 lb 9.6 oz (61.508 kg).    HEENT: no thrush or ulcers Resp: lungs clear Cardio: RRR GI: no mass, no HSM Vascular: no leg edema Neuro:mild decrease in vibratory sense at the fingers, moderate decrease at the toes.    Portacath/PICC-without erythema  Lab Results:  Lab Results  Component Value Date   WBC 3.1* 10/13/2012   HGB 10.1* 10/13/2012   HCT 30.9* 10/13/2012   MCV 93.6 10/13/2012   PLT 150 10/13/2012  ANC 1.2 CA19-9  On 09/29/12- 90.5 Medications: I have reviewed the patient's current medications.  Assessment/Plan: 1. Locally advanced pancreas cancer, adenosquamous carcinoma, presenting with a pancreas body mass and CT/ultrasound evidence of portal vein/SMV invasion. She completed cycle 1 of FOLFOX 03/10/2012. She completed cycle 5 on 05/19/2012. CA 19-9 was improved on 05/19/2012. Restaging CT scan 05/31/2012 showed a decrease in the size of the pancreatic mass. She completed cycle 8 FOLFOX on 07/07/2012 and cycle 9 on 07/28/2012. A restaging CT 08/09/2012 confirmed disease progression involving retroperitoneal nodules. Initiation of gemcitabine/Abraxane on 08/11/2012. She completed cycle 4 on 09/29/2012. 2. Delayed nausea following cycle 1 of FOLFOX. She received Aloxi and took prophylactic Decadron. 3. Pain secondary to the locally advanced pancreas mass. She continues OxyContin 40 mg twice daily.  4. Small cell lung cancer. Left perihilar/lung mass was stable on chest x-ray 04/05/2012 and on CT 05/31/2012. She completed radiation to the left chest mass on 06/29/2012 through 07/13/2012. The  mass was markedly decreased on the CT 08/09/2012. 5. Staging PET scan 02/12/2012 confirming a hypermetabolic left lung mass, hypermetabolic pancreas mass, hypermetabolic peritoneal implant. Status post bronchoscopy confirming a left endobronchial mass and left hilar mass on 02/24/2012. Biopsy of both areas confirmed small cell carcinoma. 6. Baseline nausea. Question related to tumor abutting the stomach.  7. Irregular bowel habits secondary to chemotherapy and narcotics. She has noted increased constipation since beginning the gemcitabine/Abraxane. She will continue daily MiraLAX and begin Senokot-S 2 tablets twice daily. 8. Neutropenia/thrombocytopenia 07/07/2012 secondary to chemotherapy. She received Neulasta with cycle 8 and 9 FOLFOX. 9. 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 May also be related to Abraxane.Progressive . 10. Right external hemorrhoid-improved on 08/25/2012. 11. Neutropenia/thrombocytopenia following gemcitabine/Abraxane, chemotherapy was held on 08/18/2012. The chemotherapy was held again 08/25/2012. The gemcitabine and Abraxane were dose reduced beginning 09/01/2012. Chemotherapy was switched to an every 2 week schedule. 12. Delayed nausea following cycle 3 gemcitabine/Abraxane. Aloxi was be substituted for Zofran with cycle 4.  Disposition:  She appears stable aside from increased neuropathy symptoms. The CA19-9 has improved on the gem/abraxane.  Plan to continue gem/abraxane every 2 weeks.  She knows to call for a fever.   Thornton Papas, MD  10/13/2012  9:23 PM

## 2012-10-13 NOTE — Patient Instructions (Signed)
[facility] Discharge Instructions for Patients Receiving Chemotherapy  Today you received the following chemotherapy agents Gemzar and Abraxane  To help prevent nausea and vomiting after your treatment, we encourage you to take your nausea medication as prescribed.   If you develop nausea and vomiting that is not controlled by your nausea medication, call the clinic. If it is after clinic hours your family physician or the after hours number for the clinic or go to the Emergency Department.   BELOW ARE SYMPTOMS THAT SHOULD BE REPORTED IMMEDIATELY:  *FEVER GREATER THAN 100.5 F  *CHILLS WITH OR WITHOUT FEVER  NAUSEA AND VOMITING THAT IS NOT CONTROLLED WITH YOUR NAUSEA MEDICATION  *UNUSUAL SHORTNESS OF BREATH  *UNUSUAL BRUISING OR BLEEDING  TENDERNESS IN MOUTH AND THROAT WITH OR WITHOUT PRESENCE OF ULCERS  *URINARY PROBLEMS  *BOWEL PROBLEMS  UNUSUAL RASH Items with * indicate a potential emergency and should be followed up as soon as possible.  Feel free to call the clinic you have any questions or concerns. The clinic phone number is [phone].   I have been informed and understand all the instructions given to me. I know to contact the clinic, my physician, or go to the Emergency Department if any problems should occur. I do not have any questions at this time, but understand that I may call the clinic during office hours   should I have any questions or need assistance in obtaining follow up care.

## 2012-10-13 NOTE — Progress Notes (Signed)
Okay to treat today despite counts per Dr. Josie Dixon, rn

## 2012-10-19 ENCOUNTER — Other Ambulatory Visit: Payer: Self-pay | Admitting: Oncology

## 2012-10-20 ENCOUNTER — Ambulatory Visit: Payer: BC Managed Care – PPO

## 2012-10-26 ENCOUNTER — Other Ambulatory Visit: Payer: Self-pay | Admitting: Oncology

## 2012-10-27 ENCOUNTER — Other Ambulatory Visit (HOSPITAL_BASED_OUTPATIENT_CLINIC_OR_DEPARTMENT_OTHER): Payer: BC Managed Care – PPO | Admitting: Lab

## 2012-10-27 ENCOUNTER — Telehealth: Payer: Self-pay | Admitting: *Deleted

## 2012-10-27 ENCOUNTER — Ambulatory Visit (HOSPITAL_BASED_OUTPATIENT_CLINIC_OR_DEPARTMENT_OTHER): Payer: BC Managed Care – PPO

## 2012-10-27 ENCOUNTER — Ambulatory Visit (HOSPITAL_BASED_OUTPATIENT_CLINIC_OR_DEPARTMENT_OTHER): Payer: BC Managed Care – PPO | Admitting: Nurse Practitioner

## 2012-10-27 ENCOUNTER — Telehealth: Payer: Self-pay | Admitting: Oncology

## 2012-10-27 VITALS — BP 151/82 | HR 62 | Temp 97.1°F | Resp 18 | Ht 61.0 in | Wt 132.9 lb

## 2012-10-27 DIAGNOSIS — C251 Malignant neoplasm of body of pancreas: Secondary | ICD-10-CM

## 2012-10-27 DIAGNOSIS — C3490 Malignant neoplasm of unspecified part of unspecified bronchus or lung: Secondary | ICD-10-CM

## 2012-10-27 DIAGNOSIS — C259 Malignant neoplasm of pancreas, unspecified: Secondary | ICD-10-CM

## 2012-10-27 DIAGNOSIS — C786 Secondary malignant neoplasm of retroperitoneum and peritoneum: Secondary | ICD-10-CM

## 2012-10-27 DIAGNOSIS — Z5111 Encounter for antineoplastic chemotherapy: Secondary | ICD-10-CM

## 2012-10-27 DIAGNOSIS — C3410 Malignant neoplasm of upper lobe, unspecified bronchus or lung: Secondary | ICD-10-CM

## 2012-10-27 DIAGNOSIS — R229 Localized swelling, mass and lump, unspecified: Secondary | ICD-10-CM

## 2012-10-27 LAB — CBC WITH DIFFERENTIAL/PLATELET
Basophils Absolute: 0 10*3/uL (ref 0.0–0.1)
EOS%: 3.5 % (ref 0.0–7.0)
Eosinophils Absolute: 0.1 10*3/uL (ref 0.0–0.5)
HCT: 31.5 % — ABNORMAL LOW (ref 34.8–46.6)
HGB: 10.4 g/dL — ABNORMAL LOW (ref 11.6–15.9)
LYMPH%: 17.3 % (ref 14.0–49.7)
MCH: 30.5 pg (ref 25.1–34.0)
MCV: 92.4 fL (ref 79.5–101.0)
MONO%: 32.8 % — ABNORMAL HIGH (ref 0.0–14.0)
NEUT#: 1.9 10*3/uL (ref 1.5–6.5)
NEUT%: 45.9 % (ref 38.4–76.8)
Platelets: 166 10*3/uL (ref 145–400)
RDW: 15.1 % — ABNORMAL HIGH (ref 11.2–14.5)

## 2012-10-27 MED ORDER — SODIUM CHLORIDE 0.9 % IJ SOLN
10.0000 mL | INTRAMUSCULAR | Status: DC | PRN
  Administered 2012-10-27: 10 mL
  Filled 2012-10-27: qty 10

## 2012-10-27 MED ORDER — HEPARIN SOD (PORK) LOCK FLUSH 100 UNIT/ML IV SOLN
500.0000 [IU] | Freq: Once | INTRAVENOUS | Status: AC | PRN
  Administered 2012-10-27: 500 [IU]
  Filled 2012-10-27: qty 5

## 2012-10-27 MED ORDER — PACLITAXEL PROTEIN-BOUND CHEMO INJECTION 100 MG
80.0000 mg/m2 | Freq: Once | INTRAVENOUS | Status: AC
  Administered 2012-10-27: 125 mg via INTRAVENOUS
  Filled 2012-10-27: qty 25

## 2012-10-27 MED ORDER — OXYCODONE HCL 5 MG PO TABS: ORAL_TABLET | ORAL | Status: DC

## 2012-10-27 MED ORDER — SODIUM CHLORIDE 0.9 % IV SOLN
500.0000 mg/m2 | Freq: Once | INTRAVENOUS | Status: AC
  Administered 2012-10-27: 836 mg via INTRAVENOUS
  Filled 2012-10-27: qty 22

## 2012-10-27 MED ORDER — PALONOSETRON HCL INJECTION 0.25 MG/5ML
0.2500 mg | Freq: Once | INTRAVENOUS | Status: AC
  Administered 2012-10-27: 0.25 mg via INTRAVENOUS

## 2012-10-27 MED ORDER — SODIUM CHLORIDE 0.9 % IV SOLN
Freq: Once | INTRAVENOUS | Status: AC
  Administered 2012-10-27: 11:00:00 via INTRAVENOUS

## 2012-10-27 MED ORDER — DEXAMETHASONE SODIUM PHOSPHATE 10 MG/ML IJ SOLN
10.0000 mg | Freq: Once | INTRAMUSCULAR | Status: AC
  Administered 2012-10-27: 10 mg via INTRAVENOUS

## 2012-10-27 NOTE — Patient Instructions (Addendum)
[facility] Discharge Instructions for Patients Receiving Chemotherapy  Today you received the following chemotherapy agents :  Gemzar,  Abraxane.  To help prevent nausea and vomiting after your treatment, we encourage you to take your nausea medication as instructed by your physician.    If you develop nausea and vomiting that is not controlled by your nausea medication, call the clinic. If it is after clinic hours your family physician or the after hours number for the clinic or go to the Emergency Department.   BELOW ARE SYMPTOMS THAT SHOULD BE REPORTED IMMEDIATELY:  *FEVER GREATER THAN 100.5 F  *CHILLS WITH OR WITHOUT FEVER  NAUSEA AND VOMITING THAT IS NOT CONTROLLED WITH YOUR NAUSEA MEDICATION  *UNUSUAL SHORTNESS OF BREATH  *UNUSUAL BRUISING OR BLEEDING  TENDERNESS IN MOUTH AND THROAT WITH OR WITHOUT PRESENCE OF ULCERS  *URINARY PROBLEMS  *BOWEL PROBLEMS  UNUSUAL RASH Items with * indicate a potential emergency and should be followed up as soon as possible.  One of the nurses will contact you 24 hours after your treatment. Please let the nurse know about any problems that you may have experienced. Feel free to call the clinic you have any questions or concerns. The clinic phone number is [phone].   I have been informed and understand all the instructions given to me. I know to contact the clinic, my physician, or go to the Emergency Department if any problems should occur. I do not have any questions at this time, but understand that I may call the clinic during office hours   should I have any questions or need assistance in obtaining follow up care.    __________________________________________  _____________  __________ Signature of Patient or Authorized Representative            Date                   Time    __________________________________________ Nurse's Signature    

## 2012-10-27 NOTE — Telephone Encounter (Signed)
Per staff phone call and POF I have scheduled appts. JMW  

## 2012-10-27 NOTE — Progress Notes (Signed)
OFFICE PROGRESS NOTE  Interval history:  Victoria Price returns as scheduled. She was last treated with gemcitabine/Abraxane on 10/13/2012. She has not felt well since treatment. She continues to have intermittent nausea. She vomited yesterday. Bowels continue to alternate constipation and diarrhea. She intermittently notes stool in her underwear and is not aware of having a bowel movement. She notes an alteration in taste. She notes her weight is slowly trending downward. She denies neuropathy symptoms in the hands. She feels the numbness in the toes and feet is stable. She denies shortness of breath. She has a slight productive cough. She denies fever. She attributes the cough to "allergies". She has noted a new subcutaneous nodule at the lower inner quadrant of the left breast. She continues to have back pain.   Objective: Blood pressure 151/82, pulse 62, temperature 97.1 F (36.2 C), temperature source Oral, resp. rate 18, height 5\' 1"  (1.549 m), weight 132 lb 14.4 oz (60.283 kg).  Oropharynx is without thrush or ulceration. Lungs with faint rales at the left base. Regular cardiac rhythm. Port-A-Cath site is without erythema. Abdomen is soft, tender at the right upper cord or. No hepatomegaly. Extremities are without edema. Calves are soft and nontender. Decreased strength with dorsiflexion of the right foot. Motor strength otherwise intact. 1 cm subcutaneous nodular lesion at the right low lateral back. 1 cm subcutaneous nodular lesion at the lower inner quadrant of the left breast. 1 cm subcutaneous lesion at the left anterior scalp. Marked decrease in vibratory sense at the toes per tuning fork exam. Vibratory sense intact over the fingertips per tuning fork exam.  Lab Results: Lab Results  Component Value Date   WBC 4.1 10/27/2012   HGB 10.4* 10/27/2012   HCT 31.5* 10/27/2012   MCV 92.4 10/27/2012   PLT 166 10/27/2012    Chemistry:    Chemistry      Component Value Date/Time   NA 136  10/13/2012 0825   NA 134* 07/28/2012 0945   K 3.3* 10/13/2012 0825   K 3.5 07/28/2012 0945   CL 100 10/13/2012 0825   CL 97 07/28/2012 0945   CO2 30* 10/13/2012 0825   CO2 28 07/28/2012 0945   BUN 5.0* 10/13/2012 0825   BUN 6 07/28/2012 0945   CREATININE 0.7 10/13/2012 0825   CREATININE 0.57 07/28/2012 0945      Component Value Date/Time   CALCIUM 9.1 10/13/2012 0825   CALCIUM 9.1 07/28/2012 0945   ALKPHOS 356* 10/13/2012 0825   ALKPHOS 257* 07/28/2012 0945   AST 52* 10/13/2012 0825   AST 30 07/28/2012 0945   ALT 33 10/13/2012 0825   ALT 20 07/28/2012 0945   BILITOT 0.68 10/13/2012 0825   BILITOT 0.4 07/28/2012 0945       Studies/Results: No results found.  Medications: I have reviewed the patient's current medications.  Assessment/Plan:  1. Locally advanced pancreas cancer, adenosquamous carcinoma, presenting with a pancreas body mass and CT/ultrasound evidence of portal vein/SMV invasion. She completed cycle 1 of FOLFOX 03/10/2012. She completed cycle 5 on 05/19/2012. CA 19-9 was improved on 05/19/2012. Restaging CT scan 05/31/2012 showed a decrease in the size of the pancreatic mass. She completed cycle 8 FOLFOX on 07/07/2012 and cycle 9 on 07/28/2012. A restaging CT 08/09/2012 confirmed disease progression involving retroperitoneal nodules. Initiation of gemcitabine/Abraxane on 08/11/2012. She completed cycle 4 on 09/29/2012 and cycle 5 on 10/13/2012. 2. Delayed nausea following cycle 1 of FOLFOX. She received Aloxi and took prophylactic Decadron. 3. Pain secondary to the  locally advanced pancreas mass. She continues OxyContin 40 mg twice daily.  4. Small cell lung cancer. Left perihilar/lung mass was stable on chest x-ray 04/05/2012 and on CT 05/31/2012. She completed radiation to the left chest mass on 06/29/2012 through 07/13/2012. The mass was markedly decreased on the CT 08/09/2012. 5. Staging PET scan 02/12/2012 confirming a hypermetabolic left lung mass, hypermetabolic pancreas  mass, hypermetabolic peritoneal implant. Status post bronchoscopy confirming a left endobronchial mass and left hilar mass on 02/24/2012. Biopsy of both areas confirmed small cell carcinoma. 6. Baseline nausea. Question related to tumor abutting the stomach.  7. Irregular bowel habits secondary to chemotherapy and narcotics. She has noted increased constipation since beginning the gemcitabine/Abraxane. She will continue daily MiraLAX and begin Senokot-S 2 tablets twice daily. 8. Neutropenia/thrombocytopenia 07/07/2012 secondary to chemotherapy. She received Neulasta with cycle 8 and 9 FOLFOX. 9. 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 May also be related to Abraxane. She has progressive numbness in the toes and a marked decrease in vibratory sense per tuning fork exam. 10. Right external hemorrhoid-improved on 08/25/2012. 11. Neutropenia/thrombocytopenia following gemcitabine/Abraxane, chemotherapy was held on 08/18/2012. The chemotherapy was held again 08/25/2012. The gemcitabine and Abraxane were dose reduced beginning 09/01/2012. Chemotherapy was switched to an every 2 week schedule. 12. Delayed nausea following cycle 3 gemcitabine/Abraxane. Aloxi was be substituted for Zofran with cycle 4. 13. Subcutaneous nodular lesions at the right low back, left breast and left scalp. Question metastatic deposits. 14. Weakness with dorsiflexion of the right foot. Question if secondary to peroneal nerve compression related to weight loss.  Disposition-plan to proceed with cycle 6 gemcitabine/Abraxane today as scheduled. We are referring her for restaging CT scans of the chest through pelvis 11/07/2012. She will return for a followup visit with Dr. Truett Perna on 11/08/2012. She will contact the office in the interim with any problems.  Patient seen with Dr. Truett Perna.  Lonna Cobb ANP/GNP-BC

## 2012-10-27 NOTE — Telephone Encounter (Signed)
Gave pt oral contrast for CT, informed them that they will be called by Radiology.Pt also has appt with lab , MD and chemo for November 2013

## 2012-11-07 ENCOUNTER — Other Ambulatory Visit (HOSPITAL_BASED_OUTPATIENT_CLINIC_OR_DEPARTMENT_OTHER): Payer: BC Managed Care – PPO | Admitting: Lab

## 2012-11-07 ENCOUNTER — Other Ambulatory Visit: Payer: BC Managed Care – PPO | Admitting: Lab

## 2012-11-07 ENCOUNTER — Ambulatory Visit (HOSPITAL_COMMUNITY)
Admission: RE | Admit: 2012-11-07 | Discharge: 2012-11-07 | Disposition: A | Discharge status: Home / Self Care | Payer: BC Managed Care – PPO | Source: Ambulatory Visit | Attending: Nurse Practitioner | Admitting: Nurse Practitioner

## 2012-11-07 ENCOUNTER — Telehealth: Payer: Self-pay | Admitting: *Deleted

## 2012-11-07 DIAGNOSIS — C3490 Malignant neoplasm of unspecified part of unspecified bronchus or lung: Secondary | ICD-10-CM

## 2012-11-07 DIAGNOSIS — C259 Malignant neoplasm of pancreas, unspecified: Secondary | ICD-10-CM

## 2012-11-07 DIAGNOSIS — C787 Secondary malignant neoplasm of liver and intrahepatic bile duct: Secondary | ICD-10-CM | POA: Insufficient documentation

## 2012-11-07 LAB — COMPREHENSIVE METABOLIC PANEL (CC13)
ALT: 45 U/L (ref 0–55)
AST: 77 U/L — ABNORMAL HIGH (ref 5–34)
Albumin: 3.1 g/dL — ABNORMAL LOW (ref 3.5–5.0)
Calcium: 9.6 mg/dL (ref 8.4–10.4)
Chloride: 99 mEq/L (ref 98–107)
Potassium: 3.4 mEq/L — ABNORMAL LOW (ref 3.5–5.1)
Sodium: 136 mEq/L (ref 136–145)

## 2012-11-07 LAB — CBC WITH DIFFERENTIAL/PLATELET
BASO%: 0 % (ref 0.0–2.0)
EOS%: 0.8 % (ref 0.0–7.0)
HGB: 11.3 g/dL — ABNORMAL LOW (ref 11.6–15.9)
MCH: 29.9 pg (ref 25.1–34.0)
MCHC: 32.6 g/dL (ref 31.5–36.0)
RDW: 15.8 % — ABNORMAL HIGH (ref 11.2–14.5)
lymph#: 0.2 10*3/uL — ABNORMAL LOW (ref 0.9–3.3)

## 2012-11-07 MED ORDER — IOHEXOL 300 MG/ML  SOLN
100.0000 mL | Freq: Once | INTRAMUSCULAR | Status: AC | PRN
  Administered 2012-11-07: 100 mL via INTRAVENOUS

## 2012-11-07 NOTE — Telephone Encounter (Signed)
RECEIVED THE CT CHEST, ABDOMEN, AND PELVIS REPORT. THIS REPORT WAS GIVEN TO LISA THOMAS,NP.

## 2012-11-08 ENCOUNTER — Ambulatory Visit (HOSPITAL_BASED_OUTPATIENT_CLINIC_OR_DEPARTMENT_OTHER): Payer: BC Managed Care – PPO | Admitting: Oncology

## 2012-11-08 ENCOUNTER — Telehealth: Payer: Self-pay | Admitting: Oncology

## 2012-11-08 VITALS — BP 118/68 | HR 64 | Temp 98.0°F | Resp 20 | Ht 61.0 in | Wt 131.1 lb

## 2012-11-08 DIAGNOSIS — C786 Secondary malignant neoplasm of retroperitoneum and peritoneum: Secondary | ICD-10-CM

## 2012-11-08 DIAGNOSIS — C259 Malignant neoplasm of pancreas, unspecified: Secondary | ICD-10-CM

## 2012-11-08 DIAGNOSIS — C3410 Malignant neoplasm of upper lobe, unspecified bronchus or lung: Secondary | ICD-10-CM

## 2012-11-08 DIAGNOSIS — C251 Malignant neoplasm of body of pancreas: Secondary | ICD-10-CM

## 2012-11-08 DIAGNOSIS — R229 Localized swelling, mass and lump, unspecified: Secondary | ICD-10-CM

## 2012-11-08 NOTE — Telephone Encounter (Signed)
Pt appt. With Dr. Reginia Naas @ [facility] 11/21/12@1 :30. Medical records faxed and pt aware to hand carry scans to appt.

## 2012-11-08 NOTE — Progress Notes (Signed)
[facility]    OFFICE PROGRESS NOTE   INTERVAL HISTORY:   [name] returns as scheduled. She was last treated with gemcitabine and Abraxane on 10/27/2012.  She has noted some improvement in the left abdominal pain. She continues to have pain at the lateral low back bilaterally. The subcutaneous nodules have progressed. She has noted a new lesion at the upper abdomen.   Objective:  Vital signs in last 24 hours:  Blood pressure 118/68, pulse 64, temperature 98 F (36.7 C), temperature source Oral, resp. rate 20, height 5\' 1"  (1.549 m), weight 131 lb 1.6 oz (59.467 kg).    HEENT: No thrush or ulcers Resp: Lungs clear bilaterally Cardio: Regular rate and rhythm GI: No hepatomegaly, no discrete mass Vascular: No leg edema  Skin: Cutaneous nodules at the right flank, left upper abdomen, and subxiphoid region  Muscle skeletal: ? Skull-based mass at the left parietal scalp   Portacath/PICC-without erythema  Lab Results:  Lab Results  Component Value Date   WBC 1.3* 11/07/2012   HGB 11.3* 11/07/2012   HCT 34.7* 11/07/2012   MCV 91.8 11/07/2012   PLT 150 11/07/2012   ANC 1.0 BUN 6, creatinine 0.8  X-rays: New nodules in the left lower lobe, subcutaneous nodules at the back and chest, Mark an enlargement of the pancreatic mass, new enhancing hepatic lesions, interval progression of peritoneal metastases, new ascites, new hydronephrosis of the left kidney with a potential lesion compressing the left ureter at the ureteral pelvic junction, lucent lesion at T12, increased biliary ductal dilatation   Medications: I have reviewed the patient's current medications.  Assessment/Plan: 1. Locally advanced pancreas cancer, adenosquamous carcinoma, presenting with a pancreas body mass and CT/ultrasound evidence of portal vein/SMV invasion. She completed cycle 1 of FOLFOX 03/10/2012. She completed cycle 5 on 05/19/2012. CA 19-9 was improved on 05/19/2012. Restaging CT  scan 05/31/2012 showed a decrease in the size of the pancreatic mass. She completed cycle 8 FOLFOX on 07/07/2012 and cycle 9 on 07/28/2012. A restaging CT 08/09/2012 confirmed disease progression involving retroperitoneal nodules. Initiation of gemcitabine/Abraxane on 08/11/2012. She completed cycle 4 on 09/29/2012, cycle 5 on 10/13/2012,, and cycle 6 on 10/27/2012  -restaging CT 11/07/2012 confirmed enlargement of the pancreas mass, new liver metastases, progressive peritoneal metastases, new left hydronephrosis, and new left lung nodules 2. Delayed nausea following cycle 1 of FOLFOX. She received Aloxi and took prophylactic Decadron. 3. Pain secondary to the locally advanced pancreas mass. She continues OxyContin 40 mg twice daily.  4. Small cell lung cancer. Left perihilar/lung mass was stable on chest x-ray 04/05/2012 and on CT 05/31/2012. She completed radiation to the left chest mass on 06/29/2012 through 07/13/2012. The mass was markedly decreased on the CT 08/09/2012. Radiation changes noted on CT 11/07/2012. 5. Staging PET scan 02/12/2012 confirming a hypermetabolic left lung mass, hypermetabolic pancreas mass, hypermetabolic peritoneal implant. Status post bronchoscopy confirming a left endobronchial mass and left hilar mass on 02/24/2012. Biopsy of both areas confirmed small cell carcinoma. 6. Baseline nausea. Question related to tumor abutting the stomach.  7. Irregular bowel habits secondary to chemotherapy and narcotics. She has noted increased constipation since beginning the gemcitabine/Abraxane. She will continue daily MiraLAX and begin Senokot-S 2 tablets twice daily. 8. Neutropenia/thrombocytopenia 07/07/2012 secondary to chemotherapy. She received Neulasta with cycle 8 and 9 FOLFOX. 9. 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 May also be related to Abraxane. She had progressive numbness in the toes and a marked decrease in  vibratory sense per tuning fork  exam. She reported no neuropathy symptoms today. 10. Right external hemorrhoid-she continues to have hemorrhoid discomfort 11. Neutropenia/thrombocytopenia following gemcitabine/Abraxane, chemotherapy was held on 08/18/2012. The chemotherapy was held again 08/25/2012. The gemcitabine and Abraxane were dose reduced beginning 09/01/2012. Chemotherapy was switched to an every 2 week schedule. 12. Delayed nausea following cycle 3 gemcitabine/Abraxane. Aloxi was be substituted for Zofran with cycle 4. 13. Subcutaneous nodular lesions-likely metastatic deposits from pancreas cancer 14.  Weakness with dorsiflexion of the right foot. Question if secondary to peroneal nerve compression related to weight loss.  Disposition:  I reviewed the restaging CT scans of the spleen and her husband. The CT scans reveal evidence of progressive metastatic disease. I suspect most of the CT findings are related to progression of the pancreas cancer as opposed to small cell lung cancer. However it is possible some of the findings are related to lung cancer.  She has been treated with chest radiation, FOLFOX, and gemcitabine/Abraxane. I explained the limited systemic treatment options for the progressive pancreas cancer. We decided to make a referral to the GI oncology service at [facility] to see if she might qualify for a clinical trial. It may be difficult for her to meet eligibility criteria based on the concurrent small cell lung cancer and pancreas cancer.  [name] will return for an office visit in 2-3 weeks. We did not refer her for a left ureter stent placement. The creatinine was normal on 11/07/2012.   Thornton Papas, MD  11/08/2012  7:21 PM

## 2012-11-08 NOTE — Telephone Encounter (Signed)
gv pt appt schedule for December. Tiffany in HIM aware of [facility] referral. Per pt tx for 11/29 should be cancelled and further tx will be determined after [facility] visit. Checked w/BS and per [facility] 11/29 chemo cancelled and no additional chemo appts scheduled.

## 2012-11-11 ENCOUNTER — Ambulatory Visit: Payer: BC Managed Care – PPO

## 2012-11-16 ENCOUNTER — Other Ambulatory Visit: Payer: Self-pay | Admitting: *Deleted

## 2012-11-16 DIAGNOSIS — C259 Malignant neoplasm of pancreas, unspecified: Secondary | ICD-10-CM

## 2012-11-16 MED ORDER — OXYCODONE HCL 5 MG PO TABS: ORAL_TABLET | ORAL | Status: DC

## 2012-11-16 NOTE — Telephone Encounter (Signed)
Called pt; informed her script is ready to be picked-up at her convenience.  Pt verbalized understanding.

## 2012-11-17 ENCOUNTER — Telehealth: Payer: Self-pay | Admitting: Oncology

## 2012-11-17 NOTE — Telephone Encounter (Signed)
Called pt and she is aware of her new appt time for 12/6 10:00

## 2012-11-18 ENCOUNTER — Telehealth: Payer: Self-pay | Admitting: Oncology

## 2012-11-18 ENCOUNTER — Ambulatory Visit (HOSPITAL_COMMUNITY)
Admission: RE | Admit: 2012-11-18 | Discharge: 2012-11-18 | Disposition: A | Discharge status: Home / Self Care | Payer: BC Managed Care – PPO | Source: Ambulatory Visit | Attending: Oncology | Admitting: Oncology

## 2012-11-18 ENCOUNTER — Ambulatory Visit (HOSPITAL_BASED_OUTPATIENT_CLINIC_OR_DEPARTMENT_OTHER): Payer: BC Managed Care – PPO | Admitting: Oncology

## 2012-11-18 ENCOUNTER — Other Ambulatory Visit (HOSPITAL_BASED_OUTPATIENT_CLINIC_OR_DEPARTMENT_OTHER): Payer: BC Managed Care – PPO | Admitting: Lab

## 2012-11-18 ENCOUNTER — Telehealth: Payer: Self-pay | Admitting: *Deleted

## 2012-11-18 VITALS — BP 169/85 | HR 70 | Temp 97.2°F | Resp 20 | Ht 61.0 in | Wt 134.4 lb

## 2012-11-18 VITALS — BP 153/79

## 2012-11-18 DIAGNOSIS — C251 Malignant neoplasm of body of pancreas: Secondary | ICD-10-CM

## 2012-11-18 DIAGNOSIS — R180 Malignant ascites: Secondary | ICD-10-CM | POA: Insufficient documentation

## 2012-11-18 DIAGNOSIS — C259 Malignant neoplasm of pancreas, unspecified: Secondary | ICD-10-CM

## 2012-11-18 DIAGNOSIS — C786 Secondary malignant neoplasm of retroperitoneum and peritoneum: Secondary | ICD-10-CM

## 2012-11-18 DIAGNOSIS — C3410 Malignant neoplasm of upper lobe, unspecified bronchus or lung: Secondary | ICD-10-CM

## 2012-11-18 DIAGNOSIS — R110 Nausea: Secondary | ICD-10-CM

## 2012-11-18 LAB — CBC WITH DIFFERENTIAL/PLATELET
BASO%: 0.5 % (ref 0.0–2.0)
EOS%: 0.9 % (ref 0.0–7.0)
MCH: 31.2 pg (ref 25.1–34.0)
MCHC: 33.9 g/dL (ref 31.5–36.0)
NEUT%: 73.5 % (ref 38.4–76.8)
RDW: 17 % — ABNORMAL HIGH (ref 11.2–14.5)
lymph#: 0.5 10*3/uL — ABNORMAL LOW (ref 0.9–3.3)

## 2012-11-18 LAB — COMPREHENSIVE METABOLIC PANEL (CC13)
ALT: 95 U/L — ABNORMAL HIGH (ref 0–55)
AST: 149 U/L — ABNORMAL HIGH (ref 5–34)
Calcium: 9.4 mg/dL (ref 8.4–10.4)
Chloride: 95 mEq/L — ABNORMAL LOW (ref 98–107)
Creatinine: 0.9 mg/dL (ref 0.6–1.1)

## 2012-11-18 NOTE — Telephone Encounter (Signed)
appts made and printed for pt pt aware that i will call with other tx appts and an email was sent to mw      anne

## 2012-11-18 NOTE — Progress Notes (Signed)
Met with patient to assess for needs.  She is uncomfortable due to abdominal swelling and hopes getting the fluid drawn off will make a difference.  She denies need to see dietician and states she has a good grasp on what to eat and drink.  Her attitude is very positive.  She denies need for any assistance and has a great support system.  Her husband is with her today.  Will continue to follow as needed.

## 2012-11-18 NOTE — Telephone Encounter (Signed)
Per staff message and POF I have scheduled appts. No room on 12/12, I have moved to 12/13. Scheduler to let MD know.  JMW

## 2012-11-18 NOTE — Progress Notes (Signed)
 Victoria Price    OFFICE PROGRESS NOTE   INTERVAL HISTORY:   She returns as scheduled. She has developed increased pain in the back. She reports pain in the right upper leg with ambulation. She had similar discomfort in the left leg and this has resolved. Constipation is relieved with laxatives.  She saw Dr. Morse at [facility] earlier this week. She will not qualify for a clinical trial. He recommends salvage systemic chemotherapy.  Victoria Price has noted increased abdominal distention over the past few weeks.  Objective:  Vital signs in last 24 hours:  Blood pressure 169/85, pulse 70, temperature 97.2 F (36.2 C), temperature source Oral, resp. rate 20, height 5' 1" (1.549 m), weight 134 lb 6.4 oz (60.963 kg).    HEENT: No thrush or ulcers Resp: Lungs clear bilaterally Cardio: Regular rate and rhythm GI: No hepatomegaly, no mass, the abdomen is distended Vascular: No leg edema Neuro: Alert and oriented, she ambulates without difficulty  Skin: Cutaneous nodules at the right flank, right abdominal wall, and left frontoparietal scalp   Portacath/PICC-without erythema  Lab Results:  Lab Results  Component Value Date   WBC 6.5 11/18/2012   HGB 11.6 11/18/2012   HCT 34.2* 11/18/2012   MCV 92.1 11/18/2012   PLT 184 11/18/2012   ANC 4.8    Medications: I have reviewed the patient's current medications.  Assessment/Plan: 1. Locally advanced pancreas cancer, adenosquamous carcinoma, presenting with a pancreas body mass and CT/ultrasound evidence of portal vein/SMV invasion. She completed cycle 1 of FOLFOX 03/10/2012. She completed cycle 5 on 05/19/2012. CA 19-9 was improved on 05/19/2012. Restaging CT scan 05/31/2012 showed a decrease in the size of the pancreatic mass. She completed cycle 8 FOLFOX on 07/07/2012 and cycle 9 on 07/28/2012. A restaging CT 08/09/2012 confirmed disease progression involving retroperitoneal nodules. Initiation of gemcitabine/Abraxane on  08/11/2012. She completed cycle 4 on 09/29/2012, cycle 5 on 10/13/2012,, and cycle 6 on 10/27/2012 -restaging CT 11/07/2012 confirmed enlargement of the pancreas mass, new liver metastases, progressive peritoneal metastases, new left hydronephrosis, and new left lung nodules 2. Delayed nausea following cycle 1 of FOLFOX. She received Aloxi and took prophylactic Decadron. 3. Pain secondary to the locally advanced pancreas mass. She continues OxyContin 40 mg twice daily.  4. Small cell lung cancer. Left perihilar/lung mass was stable on chest x-ray 04/05/2012 and on CT 05/31/2012. She completed radiation to the left chest mass on 06/29/2012 through 07/13/2012. The mass was markedly decreased on the CT 08/09/2012. Radiation changes noted on CT 11/07/2012. 5. Staging PET scan 02/12/2012 confirming a hypermetabolic left lung mass, hypermetabolic pancreas mass, hypermetabolic peritoneal implant. Status post bronchoscopy confirming a left endobronchial mass and left hilar mass on 02/24/2012. Biopsy of both areas confirmed small cell carcinoma. 6. Baseline nausea. Question related to tumor abutting the stomach.  7. Irregular bowel habits secondary to chemotherapy and narcotics. She has noted increased constipation since beginning the gemcitabine/Abraxane. She will continue daily MiraLAX and begin Senokot-S 2 tablets twice daily. 8. Neutropenia/thrombocytopenia 07/07/2012 secondary to chemotherapy. She received Neulasta with cycle 8 and 9 FOLFOX. 9. 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 May also be related to Abraxane. She had progressive numbness in the toes and a marked decrease in vibratory sense per tuning fork exam. She reported no neuropathy symptoms today. 10. Right external hemorrhoid-she continues to have hemorrhoid discomfort 11. Neutropenia/thrombocytopenia following gemcitabine/Abraxane, chemotherapy was held on 08/18/2012. The chemotherapy was held again 08/25/2012.  The gemcitabine and Abraxane were   dose reduced beginning 09/01/2012. Chemotherapy was switched to an every 2 week schedule. 12. Delayed nausea following cycle 3 gemcitabine/Abraxane. Aloxi was be substituted for Zofran with cycle 4. 13. Subcutaneous nodular lesions-likely metastatic deposits from pancreas cancer 14. Weakness with dorsiflexion of the right foot. Question if secondary to peroneal nerve compression related to weight loss. She did not complain of leg weakness today, but has discomfort with weightbearing at the right leg. We will obtain a plain x-ray evaluation. She knows to contact us for leg weakness/numbness or difficulty with bowel/bladder control. 15. Abdominal distention-likely related to ascites-she will be referred for a therapeutic/diagnostic paracentesis today  Disposition:  She has progressive metastatic carcinoma. Enlargement of the primary pancreas mass and ascites are most consistent with progression of the pancreas cancer. However the small cell carcinoma could be progressing as well. I discussed the case with Dr.Morse. He agrees with a trial of irinotecan/cisplatin. Victoria Price made clear that she wishes to continue salvage therapy. The plan is to begin every 2 week irinotecan/cisplatin on 11/24/2012. We reviewed potential toxicities associated with this regimen including the chance for nausea/vomiting, mucositis, diarrhea, alopecia, and hematologic toxicity. We discussed the neuropathy and ototoxicity associated with cisplatin. We discussed the abdominal cramping associated with irinotecan.  She will return for an office visit and second cycle of systemic therapy on 12/08/2012. We will followup on a plain x-ray of the right femur and pelvis today. She will undergo a therapeutic paracentesis today and the fluid will be sent for cytology.   Bryor Rami, MD  11/18/2012  5:29 PM     

## 2012-11-18 NOTE — Procedures (Signed)
US guided diagnostic/therapeutic paracentesis performed yielding 3 liters yellow fluid. A portion of the fluid was sent for cytology. No immediate complications. Incidental note made of gallstones and RUQ tenderness on exam.

## 2012-11-21 ENCOUNTER — Other Ambulatory Visit (HOSPITAL_COMMUNITY): Payer: BC Managed Care – PPO

## 2012-11-21 ENCOUNTER — Other Ambulatory Visit: Payer: Self-pay | Admitting: *Deleted

## 2012-11-21 ENCOUNTER — Telehealth: Payer: Self-pay | Admitting: *Deleted

## 2012-11-21 ENCOUNTER — Encounter (HOSPITAL_COMMUNITY): Payer: Self-pay | Admitting: Pharmacy Technician

## 2012-11-21 ENCOUNTER — Telehealth: Payer: Self-pay | Admitting: Gastroenterology

## 2012-11-21 DIAGNOSIS — K859 Acute pancreatitis without necrosis or infection, unspecified: Secondary | ICD-10-CM

## 2012-11-21 DIAGNOSIS — C259 Malignant neoplasm of pancreas, unspecified: Secondary | ICD-10-CM

## 2012-11-21 NOTE — Telephone Encounter (Signed)
Victoria Price, This is the patient that Dr. Arlyce Dice and I spoke about.  Briefly, 56 yo woman diagnosed with pancreatic body adenocarcinoma earlier this year, not surgically resectable.  Has been on chemo/xrt.  CT last month shows progressive dilation of CBD, and recent LFTs show Alk phos very high, T bili now elevated. Also ascites that was tapped last week, possibly malignatn. I think she needs biliary stenting to enable her salvage chemo options.   I cannot find time in my schedule to do the ERCP this week. Dr. Arlyce Dice (covering inpatient) has graciously agreed to schedule her.   I will forward to Dr. Truett Perna to keep him uptodate, thanks

## 2012-11-21 NOTE — Telephone Encounter (Signed)
Pt scheduled with Dr Arlyce Dice on Friday at [facility] OR at 1pm Per Dr Christella Hartigan wait until I hear from him before I contact the patient

## 2012-11-21 NOTE — Telephone Encounter (Signed)
Rob Bunting, MD 11/21/2012 10:26 AM Signed  Zella Ball,  This is the patient that Dr. Arlyce Dice and I spoke about. Briefly, 56 yo woman diagnosed with pancreatic body adenocarcinoma earlier this year, not surgically resectable. Has been on chemo/xrt. CT last month shows progressive dilation of CBD, and recent LFTs show Alk phos very high, T bili now elevated. Also ascites that was tapped last week, possibly malignatn. I think she needs biliary stenting to enable her salvage chemo options. I cannot find time in my schedule to do the ERCP this week. Dr. Arlyce Dice (covering inpatient) has graciously agreed to schedule her.  I will forward to Dr. Truett Perna to keep him uptodate, thanks   Rob Bunting, MD 11/21/2012 10:21 AM Signed  Message copied by Rachael Fee on Mon Nov 21, 2012 10:21 AM  ------  Message from: Ladene Artist  Created: Sat Nov 19, 2012 6:00 PM  The pancreas cancer has progressed. Now with CT/lab evidence of biliary obstruction. Will you consider a stent?  She would like to continue with salvage chemotherapy, but the bilirubin will need to be normal.  Thanks,  Bfrad          Encounter MyChart Messages     No messages in this encounter         Routing History       Priority  Sent On  From  To  Message Type     11/21/2012 10:26 AM  Rachael Fee, MD  Louis Meckel, MD Marlowe Kays, CMA Ladene Artist, MD  Patient Calls     11/21/2012 9:26 AM  Amy Duwaine Maxin, CNA  Donata Duff, CMA  Patient Calls       Created by     Otto Herb, CNA on 11/21/2012 9:23 AM                           Visit Pharmacy     [id number] - [place], [place] - [phone]         Contacts         Type  Contact  Phone    11/21/2012 9:23 AM  Phone (Incoming)  Melissa @ Dr. Truett Perna  [phone]    pt has obstruction in bile duct seen on CT, per Dr. Truett Perna pt needs stint before next chemo. Requested Dr. Christella Hartigan for appt

## 2012-11-21 NOTE — Telephone Encounter (Signed)
Message copied by Rachael Fee on Mon Nov 21, 2012 10:21 AM ------      Message from: Ladene Artist      Created: Sat Nov 19, 2012  6:00 PM       The pancreas cancer has progressed.  Now with CT/lab evidence of biliary obstruction.  Will you consider a stent?            She would like to continue with salvage chemotherapy, but the bilirubin will need to be normal.            Thanks,            Bfrad

## 2012-11-21 NOTE — Telephone Encounter (Signed)
Per Dr. Truett Perna, needs referral to Dr. Wendall Papa for biliary stent. Bile duct appears obstructed based on recent CT and labs on 12/6. Made patient aware of need to relieve this obstruction and to delay her chemo one week to allow stent to be placed. She verbalizes understanding and agrees. Also reviewed with her the reason for seeing Dr. Dayton Scrape for radiation to the left trochanter fracture to help pain/stability of area. She reports they have already called her about this. GI referral ordered and scheduler notified.

## 2012-11-22 ENCOUNTER — Encounter: Payer: Self-pay | Admitting: Radiation Oncology

## 2012-11-22 ENCOUNTER — Other Ambulatory Visit: Payer: Self-pay | Admitting: *Deleted

## 2012-11-22 ENCOUNTER — Ambulatory Visit
Admission: RE | Admit: 2012-11-22 | Discharge: 2012-11-22 | Disposition: A | Discharge status: Home / Self Care | Payer: BC Managed Care – PPO | Source: Ambulatory Visit | Attending: Radiation Oncology | Admitting: Radiation Oncology

## 2012-11-22 ENCOUNTER — Telehealth: Payer: Self-pay | Admitting: *Deleted

## 2012-11-22 ENCOUNTER — Telehealth: Payer: Self-pay | Admitting: Gastroenterology

## 2012-11-22 ENCOUNTER — Encounter: Payer: Self-pay | Admitting: Gastroenterology

## 2012-11-22 VITALS — BP 132/86 | HR 92 | Temp 97.4°F | Resp 20 | Ht 61.0 in | Wt 130.5 lb

## 2012-11-22 DIAGNOSIS — C259 Malignant neoplasm of pancreas, unspecified: Secondary | ICD-10-CM | POA: Insufficient documentation

## 2012-11-22 DIAGNOSIS — K859 Acute pancreatitis without necrosis or infection, unspecified: Secondary | ICD-10-CM

## 2012-11-22 DIAGNOSIS — C7951 Secondary malignant neoplasm of bone: Secondary | ICD-10-CM | POA: Insufficient documentation

## 2012-11-22 MED ORDER — ALPRAZOLAM 0.5 MG PO TABS: 0.2500 mg | ORAL_TABLET | Freq: Three times a day (TID) | ORAL | Status: AC | PRN

## 2012-11-22 NOTE — Telephone Encounter (Signed)
Happy to do it!

## 2012-11-22 NOTE — Telephone Encounter (Signed)
Called pt to inform when procedure is scheduled Went over instructions with patient

## 2012-11-22 NOTE — Telephone Encounter (Signed)
I spoke with [facility] and they can not fit in the ERCP after my 7:30AM case and before Dr. Marge Duncans 9:45AM MAC case.   They have no other MAC times that will work this week for me and so it would be great if Dr. Arlyce Dice can do it this Friday.   Rob, Thanks again

## 2012-11-22 NOTE — Progress Notes (Signed)
Followup note:  Victoria Price returns today at the request of Dr. 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 She completed a two-week course of pelvic radiotherapy to her left lung/mediastinum back on 07/11/2012. Her cough remains improved. Since then she has been receiving chemotherapy for her metastatic pancreatic carcinoma. She tells me that her last chemotherapy was earlier in November. Her CT scan of the chest/abdomen/pelvis on November 25 showed new rounded nodules in the left lower lobe consistent with lung cancer recurrence. There also subcutaneous nodules in the thorax concerning for metastatic disease. Scans of the abdomen showed marked progression of her pancreatic cancer with a pancreatic mass encompassing the celiac and SMA along with interval increase in biliary ductal dilatation along with new enhancing masses within the right hepatic lobe. There was also advancement of mesenteric and peritoneal metastatic implants along with a new left hydronephrosis. She is felt to have left ureteral up structure. She is scheduled for placement of a stent this Friday. She underwent paracentesis for her malignant ascites. 3 weeks ago she developed proximal left femur discomfort, in 2 weeks ago she developed right proximal thigh pain. She currently takes 40 mg of OxyContin by mouth twice a day in addition to [facility] for breakthrough pain. She is now taking OxyIR every 4-5 hours because of her bilateral hip discomfort. Plain films of the pelvis/femur showed a pathologic fracture of her lesser trochanter and also a metastatic deposit along the right iliac wing. I should mention she was seen at [facility] last week and she did not qualify for a clinical trial, and salvage systemic therapy was recommended.  Physical examination: Alert and oriented. She is in good spirits. Wt Readings from Last 3 Encounters:  11/22/12 130 lb 8 oz (59.194 kg)   11/18/12 134 lb 6.4 oz (60.963 kg)  11/08/12 131 lb 1.6 oz (59.467 kg)   Temp Readings from Last 3 Encounters:  11/22/12 97.4 F (36.3 C) Oral  11/18/12 97.2 F (36.2 C) Oral  11/08/12 98 F (36.7 C) Oral   BP Readings from Last 3 Encounters:  11/22/12 132/86  11/18/12 153/79  11/18/12 169/85   Pulse Readings from Last 3 Encounters:  11/22/12 92  11/18/12 70  11/08/12 64   Head and neck examination: Remarkable for regrowth of hair . Nodes: There is no palpable cervical or supraclavicular lymphadenopathy. Chest lungs clear. Abdomen protuberant with malignant ascites. There is subcutaneous nodules along the chest and abdomen. The pelvis is palpable discomfort along her right anterior iliac spine/right iliac wing. There is also palpable discomfort along her left lesser trochanter. Extremities: Without edema. Neurologic examination: Grossly nonfocal.  Laboratory data: Lab Results  Component Value Date   WBC 6.5 11/18/2012   HGB 11.6 11/18/2012   HCT 34.2* 11/18/2012   MCV 92.1 11/18/2012   PLT 184 11/18/2012   Impression: Obvious progression of her pancreatic cancer recent meds disease from her left lesser trochanter and also right iliac wing. She plans on resuming chemotherapy which he has placement of her left ureteral stent. Often as she would benefit from a single fraction of radiation therapy to her right iliac wing and also left lesser trochanter. I do not expect any toxicity whatsoever, nor do I expect this to affect her blood counts. We'll try to have her return this Thursday for simulation/treatment planning and have her treated early next week. Consent is signed today.  30 minutes was spent face-to-face the patient, primarily counseling  the patient and coordinating her care.  CC: Dr. Mancel Bale

## 2012-11-22 NOTE — Progress Notes (Signed)
Recon hx Lung Cancer/Pancreatic Cancer Progeressive Metastatic Cancer RAD TX 05/30/12-07/11/12 LEFT LUNG/MEDIASTINUM 2000 CgY/10 SESSIONS Hx Chemotherapy, Folfax, with Neulasta /gemcitabine/Abaxane,  Alert,oriented x3, using walker with slow steady gait, pale, 98% room air sats, weak,pain right thigh, lower part left back, pain level 6 now from a 1-10 scale, just took her pain pill , abdomen very distended  PELVIS XRAY 11/18/12=LYTIC LESION ANTERIOR RIGHT ILIAC WING CONSISTENT WITH METASTATIC DEPOSIT from Pancreas cancer

## 2012-11-22 NOTE — Telephone Encounter (Signed)
Message copied by Rachael Fee on Tue Nov 22, 2012  7:59 AM ------      Message from: Donata Duff      Created: Mon Nov 21, 2012  4:04 PM       Dr Glynda Jaeger says that there is not enough room to do an ERCP on Monday the 16 th were we cancelled the pt.  Do you want to keep it on Dr Marzetta Board schedule?

## 2012-11-22 NOTE — Telephone Encounter (Signed)
Called to confirm Dr. [name] is OK to do the stent since Dr. Truett Perna ordered Dr. Christella Hartigan. Reassured her that Dr. Arlyce Dice is excellent. She asked to confirm which leg Dr. Truett Perna told her to stay off of. Confirmed it was her left side that had the fracture. She reports she is having pain in her right leg also. Made her aware that there is some cancer deposit in this area also that could be causing some of her pain, especially if she is now putting more weight on this leg. She is using a walker.

## 2012-11-23 ENCOUNTER — Telehealth: Payer: Self-pay | Admitting: Oncology

## 2012-11-23 NOTE — Telephone Encounter (Signed)
called pt and made her aware that we will move her 12/13 chemo and will call with a new sch.  pt states that she is having a stent placed

## 2012-11-23 NOTE — Addendum Note (Signed)
Encounter addended by: Delynn Flavin, RN on: 11/23/2012  7:58 PM<BR>     Documentation filed: Charges VN

## 2012-11-24 ENCOUNTER — Ambulatory Visit
Admission: RE | Admit: 2012-11-24 | Discharge: 2012-11-24 | Disposition: A | Discharge status: Home / Self Care | Payer: BC Managed Care – PPO | Source: Ambulatory Visit | Attending: Radiation Oncology | Admitting: Radiation Oncology

## 2012-11-24 ENCOUNTER — Telehealth: Payer: Self-pay | Admitting: *Deleted

## 2012-11-24 ENCOUNTER — Ambulatory Visit: Payer: BC Managed Care – PPO | Admitting: Nurse Practitioner

## 2012-11-24 ENCOUNTER — Other Ambulatory Visit: Payer: BC Managed Care – PPO | Admitting: Lab

## 2012-11-24 ENCOUNTER — Encounter (HOSPITAL_COMMUNITY): Payer: Self-pay

## 2012-11-24 ENCOUNTER — Encounter (HOSPITAL_COMMUNITY)
Admission: RE | Admit: 2012-11-24 | Discharge: 2012-11-24 | Disposition: A | Discharge status: Home / Self Care | Payer: BC Managed Care – PPO | Source: Ambulatory Visit | Attending: Gastroenterology | Admitting: Gastroenterology

## 2012-11-24 DIAGNOSIS — C259 Malignant neoplasm of pancreas, unspecified: Secondary | ICD-10-CM | POA: Insufficient documentation

## 2012-11-24 DIAGNOSIS — C7951 Secondary malignant neoplasm of bone: Secondary | ICD-10-CM

## 2012-11-24 DIAGNOSIS — Z510 Encounter for antineoplastic radiation therapy: Secondary | ICD-10-CM | POA: Insufficient documentation

## 2012-11-24 DIAGNOSIS — C3490 Malignant neoplasm of unspecified part of unspecified bronchus or lung: Secondary | ICD-10-CM | POA: Insufficient documentation

## 2012-11-24 HISTORY — DX: Hypothyroidism, unspecified: E03.9

## 2012-11-24 LAB — BASIC METABOLIC PANEL
CO2: 28 mEq/L (ref 19–32)
Calcium: 10.3 mg/dL (ref 8.4–10.5)
Creatinine, Ser: 0.96 mg/dL (ref 0.50–1.10)
GFR calc Af Amer: 76 mL/min — ABNORMAL LOW (ref >90)
Sodium: 130 mEq/L — ABNORMAL LOW (ref 135–145)

## 2012-11-24 LAB — CBC
MCV: 86.3 fL (ref 78.0–100.0)
Platelets: 229 10*3/uL (ref 150–400)
RBC: 3.95 MIL/uL (ref 3.87–5.11)
RDW: 16.8 % — ABNORMAL HIGH (ref 11.5–15.5)
WBC: 7.5 10*3/uL (ref 4.0–10.5)

## 2012-11-24 NOTE — Telephone Encounter (Signed)
Per staff message I have moved appts. I moved 12/26 to 1/2 and 1/9 to 1/16.  JMW

## 2012-11-24 NOTE — Progress Notes (Signed)
Simulation/treatment planning note The patient was taken to the CT simulator. A Vac Loc immobilization device was constructed for immobilization. She is setup to AP PA fields to her right iliac crest and also her left lesser trochanter. One set of multileaf collimators was used for is site for a total of 3 complex treatment devices. I prescribing a single fraction of 1800 cGy utilizing 15 MV photons. Isodose plans and dosimetry are requested.

## 2012-11-24 NOTE — Pre-Procedure Instructions (Signed)
[address] GRACEANNE GUIN  11/24/2012   Your procedure is scheduled on:  11/25/12  Report to [facility] at 1130 AM.  Call this number if you have problems the morning of surgery: [phone]   Remember:   Do not eat food:After Midnight.   Take these medicines the morning of surgery with A SIP OF WATER: xanax,estradiol,all inhalers,synthroid,zofran,oxycodone,eye droops   Do not wear jewelry, make-up or nail polish.  Do not wear lotions, powders, or perfumes. You may wear deodorant.  Do not shave 48 hours prior to surgery. Men may shave face and neck.  Do not bring valuables to the hospital.  Contacts, dentures or bridgework may not be worn into surgery.  Leave suitcase in the car. After surgery it may be brought to your room.  For patients admitted to the hospital, checkout time is 11:00 AM the day of discharge.   Patients discharged the day of surgery will not be allowed to drive home.  Name and phone number of your driver: family  Special Instructions: Shower using CHG 2 nights before surgery and the night before surgery.  If you shower the day of surgery use CHG.  Use special wash - you have one bottle of CHG for all showers.  You should use approximately 1/3 of the bottle for each shower.   Please read over the following fact sheets that you were given: Pain Booklet, Coughing and Deep Breathing, MRSA Information and Surgical Site Infection Prevention

## 2012-11-25 ENCOUNTER — Ambulatory Visit: Payer: BC Managed Care – PPO

## 2012-11-25 ENCOUNTER — Encounter (HOSPITAL_COMMUNITY): Payer: Self-pay

## 2012-11-25 ENCOUNTER — Ambulatory Visit (HOSPITAL_COMMUNITY): Payer: BC Managed Care – PPO | Admitting: Certified Registered"

## 2012-11-25 ENCOUNTER — Other Ambulatory Visit: Payer: Self-pay | Admitting: *Deleted

## 2012-11-25 ENCOUNTER — Encounter (HOSPITAL_COMMUNITY)
Admission: RE | Disposition: A | Discharge status: Home / Self Care | Payer: Self-pay | Source: Ambulatory Visit | Attending: Gastroenterology

## 2012-11-25 ENCOUNTER — Ambulatory Visit (HOSPITAL_COMMUNITY)
Admission: RE | Admit: 2012-11-25 | Discharge: 2012-11-25 | Disposition: A | Discharge status: Home / Self Care | Payer: BC Managed Care – PPO | Source: Ambulatory Visit | Attending: Gastroenterology | Admitting: Gastroenterology

## 2012-11-25 ENCOUNTER — Ambulatory Visit (HOSPITAL_COMMUNITY): Payer: BC Managed Care – PPO

## 2012-11-25 ENCOUNTER — Encounter (HOSPITAL_COMMUNITY): Payer: Self-pay | Admitting: *Deleted

## 2012-11-25 ENCOUNTER — Encounter (HOSPITAL_COMMUNITY): Payer: Self-pay | Admitting: Certified Registered"

## 2012-11-25 DIAGNOSIS — Z01812 Encounter for preprocedural laboratory examination: Secondary | ICD-10-CM | POA: Insufficient documentation

## 2012-11-25 DIAGNOSIS — K831 Obstruction of bile duct: Secondary | ICD-10-CM | POA: Insufficient documentation

## 2012-11-25 DIAGNOSIS — K449 Diaphragmatic hernia without obstruction or gangrene: Secondary | ICD-10-CM | POA: Insufficient documentation

## 2012-11-25 DIAGNOSIS — C251 Malignant neoplasm of body of pancreas: Secondary | ICD-10-CM | POA: Insufficient documentation

## 2012-11-25 DIAGNOSIS — Z8701 Personal history of pneumonia (recurrent): Secondary | ICD-10-CM | POA: Insufficient documentation

## 2012-11-25 DIAGNOSIS — K859 Acute pancreatitis without necrosis or infection, unspecified: Secondary | ICD-10-CM

## 2012-11-25 DIAGNOSIS — J45909 Unspecified asthma, uncomplicated: Secondary | ICD-10-CM | POA: Insufficient documentation

## 2012-11-25 DIAGNOSIS — E039 Hypothyroidism, unspecified: Secondary | ICD-10-CM | POA: Insufficient documentation

## 2012-11-25 HISTORY — PX: ERCP: SHX5425

## 2012-11-25 SURGERY — ERCP, WITH INTERVENTION IF INDICATED
Anesthesia: General

## 2012-11-25 MED ORDER — LACTATED RINGERS IV SOLN
INTRAVENOUS | Status: DC | PRN
  Administered 2012-11-25: 14:00:00 via INTRAVENOUS

## 2012-11-25 MED ORDER — LIDOCAINE HCL (CARDIAC) 20 MG/ML IV SOLN
INTRAVENOUS | Status: DC | PRN
  Administered 2012-11-25: 50 mg via INTRAVENOUS

## 2012-11-25 MED ORDER — FENTANYL CITRATE 0.05 MG/ML IJ SOLN
INTRAMUSCULAR | Status: DC | PRN
  Administered 2012-11-25: 100 ug via INTRAVENOUS
  Administered 2012-11-25: 50 ug via INTRAVENOUS
  Administered 2012-11-25: 100 ug via INTRAVENOUS

## 2012-11-25 MED ORDER — PHENYLEPHRINE HCL 10 MG/ML IJ SOLN
INTRAMUSCULAR | Status: DC | PRN
  Administered 2012-11-25 (×2): 80 ug via INTRAVENOUS

## 2012-11-25 MED ORDER — PROPOFOL 10 MG/ML IV BOLUS
INTRAVENOUS | Status: DC | PRN
  Administered 2012-11-25: 150 mg via INTRAVENOUS

## 2012-11-25 MED ORDER — FENTANYL CITRATE 0.05 MG/ML IJ SOLN: 25.0000 ug | INTRAMUSCULAR | Status: DC | PRN

## 2012-11-25 MED ORDER — MIDAZOLAM HCL 5 MG/5ML IJ SOLN
INTRAMUSCULAR | Status: DC | PRN
  Administered 2012-11-25: 2 mg via INTRAVENOUS

## 2012-11-25 MED ORDER — GLUCAGON HCL (RDNA) 1 MG IJ SOLR
INTRAMUSCULAR | Status: DC | PRN
  Administered 2012-11-25: 1 mg via INTRAVENOUS

## 2012-11-25 MED ORDER — SODIUM CHLORIDE 0.9 % IV SOLN
INTRAVENOUS | Status: DC | PRN
  Administered 2012-11-25: 16:00:00

## 2012-11-25 MED ORDER — METOCLOPRAMIDE HCL 5 MG/ML IJ SOLN: 10.0000 mg | Freq: Once | INTRAMUSCULAR | Status: DC | PRN

## 2012-11-25 MED ORDER — SODIUM CHLORIDE 0.9 % IV SOLN: INTRAVENOUS | Status: DC

## 2012-11-25 MED ORDER — ONDANSETRON HCL 4 MG/2ML IJ SOLN
INTRAMUSCULAR | Status: DC | PRN
  Administered 2012-11-25: 4 mg via INTRAVENOUS

## 2012-11-25 MED ORDER — SUCCINYLCHOLINE CHLORIDE 20 MG/ML IJ SOLN
INTRAMUSCULAR | Status: DC | PRN
  Administered 2012-11-25: 100 mg via INTRAVENOUS

## 2012-11-25 MED ORDER — LACTATED RINGERS IV SOLN
INTRAVENOUS | Status: DC
  Administered 2012-11-25: 14:00:00 via INTRAVENOUS

## 2012-11-25 MED ORDER — SODIUM CHLORIDE 0.9 % IV SOLN
1.5000 g | INTRAVENOUS | Status: AC
  Administered 2012-11-25: 1.5 g via INTRAVENOUS
  Filled 2012-11-25: qty 1.5

## 2012-11-25 NOTE — Anesthesia Postprocedure Evaluation (Signed)
Anesthesia Post Note  Patient: Victoria Price  Procedure(s) Performed: Procedure(s) (LRB): ENDOSCOPIC RETROGRADE CHOLANGIOPANCREATOGRAPHY (ERCP) (N/A)  Anesthesia type: general  Patient location: PACU  Post pain: Pain level controlled  Post assessment: Patient's Cardiovascular Status Stable  Last Vitals:  Filed Vitals:   11/25/12 1700  BP: 139/74  Pulse: 88  Temp:   Resp: 13    Post vital signs: Reviewed and stable  Level of consciousness: sedated  Complications: No apparent anesthesia complications

## 2012-11-25 NOTE — Preoperative (Signed)
Beta Blockers   Reason not to administer Beta Blockers:Not Applicable 

## 2012-11-25 NOTE — Anesthesia Preprocedure Evaluation (Signed)
Anesthesia Evaluation  Patient identified by MRN, date of birth, ID band Patient awake    Reviewed: Allergy & Precautions, H&P , NPO status , Patient's Chart, lab work & pertinent test results, reviewed documented beta blocker date and time   Airway Mallampati: II TM Distance: >3 FB Neck ROM: full    Dental   Pulmonary neg pulmonary ROS, asthma , pneumonia -, resolved,  breath sounds clear to auscultation        Cardiovascular negative cardio ROS  Rhythm:regular     Neuro/Psych negative neurological ROS  negative psych ROS   GI/Hepatic Neg liver ROS, hiatal hernia,   Endo/Other  Hypothyroidism   Renal/GU negative Renal ROS  negative genitourinary   Musculoskeletal   Abdominal   Peds  Hematology  (+) Blood dyscrasia, anemia ,   Anesthesia Other Findings See surgeon's H&P   Reproductive/Obstetrics negative OB ROS                           Anesthesia Physical Anesthesia Plan  ASA: II  Anesthesia Plan: General   Post-op Pain Management:    Induction: Intravenous  Airway Management Planned: Oral ETT  Additional Equipment:   Intra-op Plan:   Post-operative Plan: Extubation in OR  Informed Consent: I have reviewed the patients History and Physical, chart, labs and discussed the procedure including the risks, benefits and alternatives for the proposed anesthesia with the patient or authorized representative who has indicated his/her understanding and acceptance.   Dental Advisory Given  Plan Discussed with: CRNA and Surgeon  Anesthesia Plan Comments:         Anesthesia Quick Evaluation

## 2012-11-25 NOTE — H&P (View-Only) (Signed)
Victoria Price    OFFICE PROGRESS NOTE   INTERVAL HISTORY:   She returns as scheduled. She has developed increased pain in the back. She reports pain in the right upper leg with ambulation. She had similar discomfort in the left leg and this has resolved. Constipation is relieved with laxatives.  She saw Dr. Lattie Corns at [facility] earlier this week. She will not qualify for a clinical trial. He recommends salvage systemic chemotherapy.  Victoria Price has noted increased abdominal distention over the past few weeks.  Objective:  Vital signs in last 24 hours:  Blood pressure 169/85, pulse 70, temperature 97.2 F (36.2 C), temperature source Oral, resp. rate 20, height 5\' 1"  (1.549 m), weight 134 lb 6.4 oz (60.963 kg).    HEENT: No thrush or ulcers Resp: Lungs clear bilaterally Cardio: Regular rate and rhythm GI: No hepatomegaly, no mass, the abdomen is distended Vascular: No leg edema Neuro: Alert and oriented, she ambulates without difficulty  Skin: Cutaneous nodules at the right flank, right abdominal wall, and left frontoparietal scalp   Portacath/PICC-without erythema  Lab Results:  Lab Results  Component Value Date   WBC 6.5 11/18/2012   HGB 11.6 11/18/2012   HCT 34.2* 11/18/2012   MCV 92.1 11/18/2012   PLT 184 11/18/2012   ANC 4.8    Medications: I have reviewed the patient's current medications.  Assessment/Plan: 1. Locally advanced pancreas cancer, adenosquamous carcinoma, presenting with a pancreas body mass and CT/ultrasound evidence of portal vein/SMV invasion. She completed cycle 1 of FOLFOX 03/10/2012. She completed cycle 5 on 05/19/2012. CA 19-9 was improved on 05/19/2012. Restaging CT scan 05/31/2012 showed a decrease in the size of the pancreatic mass. She completed cycle 8 FOLFOX on 07/07/2012 and cycle 9 on 07/28/2012. A restaging CT 08/09/2012 confirmed disease progression involving retroperitoneal nodules. Initiation of gemcitabine/Abraxane on  08/11/2012. She completed cycle 4 on 09/29/2012, cycle 5 on 10/13/2012,, and cycle 6 on 10/27/2012 -restaging CT 11/07/2012 confirmed enlargement of the pancreas mass, new liver metastases, progressive peritoneal metastases, new left hydronephrosis, and new left lung nodules 2. Delayed nausea following cycle 1 of FOLFOX. She received Aloxi and took prophylactic Decadron. 3. Pain secondary to the locally advanced pancreas mass. She continues OxyContin 40 mg twice daily.  4. Small cell lung cancer. Left perihilar/lung mass was stable on chest x-ray 04/05/2012 and on CT 05/31/2012. She completed radiation to the left chest mass on 06/29/2012 through 07/13/2012. The mass was markedly decreased on the CT 08/09/2012. Radiation changes noted on CT 11/07/2012. 5. Staging PET scan 02/12/2012 confirming a hypermetabolic left lung mass, hypermetabolic pancreas mass, hypermetabolic peritoneal implant. Status post bronchoscopy confirming a left endobronchial mass and left hilar mass on 02/24/2012. Biopsy of both areas confirmed small cell carcinoma. 6. Baseline nausea. Question related to tumor abutting the stomach.  7. Irregular bowel habits secondary to chemotherapy and narcotics. She has noted increased constipation since beginning the gemcitabine/Abraxane. She will continue daily MiraLAX and begin Senokot-S 2 tablets twice daily. 8. Neutropenia/thrombocytopenia 07/07/2012 secondary to chemotherapy. She received Neulasta with cycle 8 and 9 FOLFOX. 9. 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 May also be related to Abraxane. She had progressive numbness in the toes and a marked decrease in vibratory sense per tuning fork exam. She reported no neuropathy symptoms today. 10. Right external hemorrhoid-she continues to have hemorrhoid discomfort 11. Neutropenia/thrombocytopenia following gemcitabine/Abraxane, chemotherapy was held on 08/18/2012. The chemotherapy was held again 08/25/2012.  The gemcitabine and Abraxane were  dose reduced beginning 09/01/2012. Chemotherapy was switched to an every 2 week schedule. 12. Delayed nausea following cycle 3 gemcitabine/Abraxane. Aloxi was be substituted for Zofran with cycle 4. 13. Subcutaneous nodular lesions-likely metastatic deposits from pancreas cancer 14. Weakness with dorsiflexion of the right foot. Question if secondary to peroneal nerve compression related to weight loss. She did not complain of leg weakness today, but has discomfort with weightbearing at the right leg. We will obtain a plain x-ray evaluation. She knows to contact us for leg weakness/numbness or difficulty with bowel/bladder control. 15. Abdominal distention-likely related to ascites-she will be referred for a therapeutic/diagnostic paracentesis today  Disposition:  She has progressive metastatic carcinoma. Enlargement of the primary pancreas mass and ascites are most consistent with progression of the pancreas cancer. However the small cell carcinoma could be progressing as well. I discussed the case with Dr.Morse. He agrees with a trial of irinotecan/cisplatin. Victoria Price made clear that she wishes to continue salvage therapy. The plan is to begin every 2 week irinotecan/cisplatin on 11/24/2012. We reviewed potential toxicities associated with this regimen including the chance for nausea/vomiting, mucositis, diarrhea, alopecia, and hematologic toxicity. We discussed the neuropathy and ototoxicity associated with cisplatin. We discussed the abdominal cramping associated with irinotecan.  She will return for an office visit and second cycle of systemic therapy on 12/08/2012. We will followup on a plain x-ray of the right femur and pelvis today. She will undergo a therapeutic paracentesis today and the fluid will be sent for cytology.   Thornton Papas, MD  11/18/2012  5:29 PM

## 2012-11-25 NOTE — Transfer of Care (Signed)
Immediate Anesthesia Transfer of Care Note  Patient: Victoria Price  Procedure(s) Performed: Procedure(s) (LRB) with comments: ENDOSCOPIC RETROGRADE CHOLANGIOPANCREATOGRAPHY (ERCP) (N/A)  Patient Location: PACU  Anesthesia Type:General  Level of Consciousness: awake, alert , oriented and patient cooperative  Airway & Oxygen Therapy: Patient Spontanous Breathing and Patient connected to nasal cannula oxygen  Post-op Assessment: Report given to PACU RN, Post -op Vital signs reviewed and stable and Patient moving all extremities  Post vital signs: Reviewed and stable  Complications: No apparent anesthesia complications

## 2012-11-25 NOTE — Op Note (Signed)
[facility] [address] [place], [address]   ERCP PROCEDURE REPORT  PATIENT: Victoria, Price.  MR# :[id number] BIRTHDATE: 17-Jan-1956  GENDER: Female ENDOSCOPIST: Louis Meckel, MD REFERRED BY: Mardelle Matte, M.D.  Rob Bunting, M.D. PROCEDURE DATE:  11/25/2012 PROCEDURE:   ERCP with stent placement ASA CLASS:   Class III INDICATIONS:common bile duct stricture. MEDICATIONS: MAC sedation, administered by CRNA and Antibiotics unasyn 1.5 gm IV TOPICAL ANESTHETIC:  DESCRIPTION OF PROCEDURE:   After the risks benefits and alternatives of the procedure were thoroughly explained, informed consent was obtained.  The Pentax ERCP [id number]  endoscope was introduced through the mouth  and advanced to the second portion of the duodenum .  1.  CBD was selectively cannulated with a 0.19mm guidewire.  There was a high grade stricture at the midportion of the duct measuring 2mm in length, with marked bile duct dilitation proximal to the stricture. A 10Fr 6cm covered biliary metal stent was placed across the stricture.  There was excellent emptying of contrast and dye. In the distal duct there were 2 filling defects measuring 2mm raising the question of stones. 2.  CBD was selectively cannulated with a 0.32mm guidewire.  There was a high grade stricture at the midportion of the duct measuring 2mm in length, with marked bile duct dilitation proximal to the stricture. A 10Fr 6cm covered biliary metal stent was placed across the stricture.  There was excellent emptying of contrast and dye. In the distal duct there were 2 filling defects measuring 2mm raising the question of stones. The scope was then completely withdrawn from the patient and the procedure terminated.     COMPLICATIONS:  ENDOSCOPIC IMPRESSION: 1. 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  ? Small CBD stones.  RECOMMENDATIONS: No further  Rx    _______________________________ eSigned:  Louis Meckel, MD 11/25/2012 4:19 PM   CC:

## 2012-11-25 NOTE — Anesthesia Procedure Notes (Signed)
Procedure Name: Intubation Date/Time: 11/25/2012 3:02 PM Performed by: Jerilee Hoh Pre-anesthesia Checklist: Patient identified, Emergency Drugs available, Suction available and Patient being monitored Patient Re-evaluated:Patient Re-evaluated prior to inductionOxygen Delivery Method: Circle system utilized Preoxygenation: Pre-oxygenation with 100% oxygen Intubation Type: IV induction Ventilation: Mask ventilation without difficulty Laryngoscope Size: Mac and 3 Grade View: Grade I Tube type: Oral Tube size: 7.0 mm Number of attempts: 1 Airway Equipment and Method: Stylet Placement Confirmation: ETT inserted through vocal cords under direct vision,  positive ETCO2 and breath sounds checked- equal and bilateral Secured at: 21 cm Tube secured with: Tape Dental Injury: Teeth and Oropharynx as per pre-operative assessment

## 2012-11-25 NOTE — Interval H&P Note (Signed)
History and Physical Interval Note:  11/25/2012 2:45 PM  Victoria Price  has presented today for surgery, with the diagnosis of pancreatitis  The various methods of treatment have been discussed with the patient and family. After consideration of risks, benefits and other options for treatment, the patient has consented to  Procedure(s) (LRB) with comments: ENDOSCOPIC RETROGRADE CHOLANGIOPANCREATOGRAPHY (ERCP) (N/A) as a surgical intervention .  The patient's history has been reviewed, patient examined,  stable for surgery. She has developed obstructive jaundice. I have reviewed the patient's chart and labs.  Questions were answered to the patient's satisfaction.     The recent H&P (dated *11/18/12**) was reviewed, the patient was examined and there is no change in the patients condition since that H&P was completed.   [name]  11/25/2012, 2:45 PM   [name]

## 2012-11-25 NOTE — OR Nursing (Signed)
Pt. Positioned prone on chest rolls, with arms padded and tucked. Rolled foam placed under both shoulders. Pillows under knees and calves Foam pad under toes Foam pad under head Safety strap across upper thighs. Additional staff assisted with transporting pt. From stretcher to OR table due to history of pathological fractures.  Note by Magnus Ivan RN

## 2012-11-27 ENCOUNTER — Telehealth: Payer: Self-pay | Admitting: Physician Assistant

## 2012-11-28 ENCOUNTER — Other Ambulatory Visit: Payer: Self-pay | Admitting: *Deleted

## 2012-11-28 ENCOUNTER — Encounter (HOSPITAL_COMMUNITY): Payer: Self-pay | Admitting: Gastroenterology

## 2012-11-28 ENCOUNTER — Encounter: Payer: Self-pay | Admitting: Radiation Oncology

## 2012-11-28 DIAGNOSIS — C259 Malignant neoplasm of pancreas, unspecified: Secondary | ICD-10-CM

## 2012-11-28 MED ORDER — OXYCODONE HCL 5 MG PO TABS: 5.0000 mg | ORAL_TABLET | ORAL | Status: AC | PRN

## 2012-11-28 NOTE — Telephone Encounter (Signed)
Patient reports she needs refill on her oxycodone-only has #3 pills left. Still reports her pain as "7-8" taking oxyir 10 mg every 4 hours with oxycontin 40 mg twice daily. Stent placed on 12/13. Still feeling very bloated. Being seen in radiation oncology tomorrow. Denies that pain is any worse than it was prior to ERCP.

## 2012-11-28 NOTE — Progress Notes (Signed)
Chart note: I reviewed Ms. maintenance treatment plans to her left proximal femur and right pelvis. We're using wedge compensation along the left femur for an additional treatment device. Thus, she has a total of for complex treatment devices including her Vac Loc immobilization.

## 2012-11-29 ENCOUNTER — Encounter: Payer: Self-pay | Admitting: Radiation Oncology

## 2012-11-29 ENCOUNTER — Other Ambulatory Visit: Payer: Self-pay | Admitting: *Deleted

## 2012-11-29 ENCOUNTER — Telehealth: Payer: Self-pay | Admitting: *Deleted

## 2012-11-29 ENCOUNTER — Ambulatory Visit
Admission: RE | Admit: 2012-11-29 | Discharge: 2012-11-29 | Disposition: A | Discharge status: Home / Self Care | Payer: BC Managed Care – PPO | Source: Ambulatory Visit | Attending: Radiation Oncology | Admitting: Radiation Oncology

## 2012-11-29 NOTE — Progress Notes (Signed)
Weekly Management Note:  Site: Left proximal femur/lesser trochanter, and right iliac wing Current Dose:  800  cGy Projected Dose: 800  cGy  Narrative: The patient is seen today for routine under treatment assessment. CBCT/MVCT images/port films were reviewed. The chart was reviewed.   No new complaints today except for worsening abdominal distention discomfort. She tolerated treatment well.  Physical Examination: There were no vitals filed for this visit..  Weight:  . No change.  Impression: Tolerating radiation therapy well.  Plan: Continue radiation therapy as planned. Followup visit in one month. Dr. Truett Perna will address her worsening malignant ascites.

## 2012-11-29 NOTE — Telephone Encounter (Signed)
Reports still feeling very bloated and uncomfortable. Per Dr. Marily Memos see her in office tomorrow to discuss pain management. Patient agrees to appointment with midlevel.

## 2012-11-30 ENCOUNTER — Ambulatory Visit (HOSPITAL_COMMUNITY)
Admission: RE | Admit: 2012-11-30 | Discharge: 2012-11-30 | Disposition: A | Discharge status: Home / Self Care | Payer: BC Managed Care – PPO | Source: Ambulatory Visit | Attending: Nurse Practitioner | Admitting: Nurse Practitioner

## 2012-11-30 ENCOUNTER — Ambulatory Visit (HOSPITAL_BASED_OUTPATIENT_CLINIC_OR_DEPARTMENT_OTHER): Payer: BC Managed Care – PPO | Admitting: Nurse Practitioner

## 2012-11-30 ENCOUNTER — Other Ambulatory Visit (HOSPITAL_BASED_OUTPATIENT_CLINIC_OR_DEPARTMENT_OTHER): Payer: BC Managed Care – PPO

## 2012-11-30 ENCOUNTER — Telehealth: Payer: Self-pay | Admitting: Oncology

## 2012-11-30 ENCOUNTER — Encounter: Payer: Self-pay | Admitting: Radiation Oncology

## 2012-11-30 VITALS — BP 130/75 | HR 98 | Temp 97.2°F | Resp 18 | Ht 61.0 in | Wt 132.9 lb

## 2012-11-30 DIAGNOSIS — C259 Malignant neoplasm of pancreas, unspecified: Secondary | ICD-10-CM

## 2012-11-30 DIAGNOSIS — C7800 Secondary malignant neoplasm of unspecified lung: Secondary | ICD-10-CM

## 2012-11-30 DIAGNOSIS — C787 Secondary malignant neoplasm of liver and intrahepatic bile duct: Secondary | ICD-10-CM

## 2012-11-30 DIAGNOSIS — C251 Malignant neoplasm of body of pancreas: Secondary | ICD-10-CM

## 2012-11-30 DIAGNOSIS — C786 Secondary malignant neoplasm of retroperitoneum and peritoneum: Secondary | ICD-10-CM

## 2012-11-30 DIAGNOSIS — R188 Other ascites: Secondary | ICD-10-CM | POA: Insufficient documentation

## 2012-11-30 DIAGNOSIS — C3490 Malignant neoplasm of unspecified part of unspecified bronchus or lung: Secondary | ICD-10-CM

## 2012-11-30 LAB — COMPREHENSIVE METABOLIC PANEL (CC13)
Albumin: 2.7 g/dL — ABNORMAL LOW (ref 3.5–5.0)
Alkaline Phosphatase: 637 U/L — ABNORMAL HIGH (ref 40–150)
BUN: 17 mg/dL (ref 7.0–26.0)
Glucose: 101 mg/dl — ABNORMAL HIGH (ref 70–99)
Total Bilirubin: 2.86 mg/dL — ABNORMAL HIGH (ref 0.20–1.20)

## 2012-11-30 LAB — CBC WITH DIFFERENTIAL/PLATELET
Basophils Absolute: 0 10*3/uL (ref 0.0–0.1)
Eosinophils Absolute: 0.1 10*3/uL (ref 0.0–0.5)
HGB: 12.7 g/dL (ref 11.6–15.9)
LYMPH%: 4.6 % — ABNORMAL LOW (ref 14.0–49.7)
MCV: 91.9 fL (ref 79.5–101.0)
MONO%: 14.1 % — ABNORMAL HIGH (ref 0.0–14.0)
NEUT#: 7.1 10*3/uL — ABNORMAL HIGH (ref 1.5–6.5)
NEUT%: 80.3 % — ABNORMAL HIGH (ref 38.4–76.8)
Platelets: 204 10*3/uL (ref 145–400)

## 2012-11-30 MED ORDER — ONDANSETRON HCL 8 MG PO TABS: 8.0000 mg | ORAL_TABLET | Freq: Three times a day (TID) | ORAL | Status: AC | PRN

## 2012-11-30 NOTE — Progress Notes (Signed)
OFFICE PROGRESS NOTE  Interval history:  Ms. Sevillano underwent a paracentesis procedure on 11/21/2012 with 3 L of yellow fluid removed. Cytology confirmed malignant cells consistent with metastatic adenocarcinoma. She was originally scheduled for the first treatment with irinotecan/cisplatin on 11/24/2012. Chemotherapy was placed on hold due to obstructive jaundice. 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 A metal stent was placed.   Plain x-ray of the pelvis on 11/18/2012 showed a pathologic fracture left lesser trochanter and metastatic deposit right iliac wing. She received a single fraction of radiation to the left proximal femur/lesser trochanter, right iliac crest on 11/29/2012.  Abdominal distention has recurred. She would like to have another paracentesis performed. She is having increased nausea and pain at the left abdomen. Appetite is poor. She is sleeping more. She is intermittently confused. She was recently constipated. She had a bowel movement earlier today.   Objective: Blood pressure 130/75, pulse 98, temperature 97.2 F (36.2 C), temperature source Oral, resp. rate 18, height 5\' 1"  (1.549 m), weight 132 lb 14.4 oz (60.283 kg), SpO2 99.00%.  Oropharynx is without thrush or ulceration. Lungs are clear. Breath sounds are diminished at the left lower lung field. No respiratory distress. Regular cardiac rhythm. Port-A-Cath site is without erythema. Abdomen is markedly distended. She appears to have ascites. Lower leg edema bilaterally.  Lab Results: Lab Results  Component Value Date   WBC 8.8 11/30/2012   HGB 12.7 11/30/2012   HCT 38.0 11/30/2012   MCV 91.9 11/30/2012   PLT 204 11/30/2012    Chemistry:    Chemistry      Component Value Date/Time   NA 130* 11/24/2012 1143   NA 134* 11/18/2012 1003   K 3.9 11/24/2012 1143   K 3.3* 11/18/2012 1003   CL 92* 11/24/2012 1143   CL 95* 11/18/2012 1003   CO2 28  11/24/2012 1143   CO2 28 11/18/2012 1003   BUN 8 11/24/2012 1143   BUN 5.0* 11/18/2012 1003   CREATININE 0.96 11/24/2012 1143   CREATININE 0.9 11/18/2012 1003      Component Value Date/Time   CALCIUM 10.3 11/24/2012 1143   CALCIUM 9.4 11/18/2012 1003   ALKPHOS 1,246 Repeated and Verified* 11/18/2012 1003   ALKPHOS 257* 07/28/2012 0945   AST 149* 11/18/2012 1003   AST 30 07/28/2012 0945   ALT 95* 11/18/2012 1003   ALT 20 07/28/2012 0945   BILITOT 2.17* 11/18/2012 1003   BILITOT 0.4 07/28/2012 0945       Studies/Results: Dg Pelvis 1-2 Views  11/18/2012  *RADIOLOGY REPORT*  Clinical Data: New onset right femur pain in a patient with pancreatic carcinoma.  PELVIS - 1-2 VIEW  Comparison: CT abdomen and pelvis 11/07/2012 and 01/26/2012.  Findings: There is a lytic lesion in the anterior right iliac wing consistent with a metastatic deposit.  There is also a pathologic fracture of the left lesser trochanter.   In retrospect, these lesions were present on the 11/07/2012 CT scan but are not seen on the 01/26/2012 study.  IMPRESSION: Pathologic fracture left lesser trochanter and metastatic deposit right iliac wing.   Original Report Authenticated By: Holley Dexter, M.D.    Dg Femur Right  11/18/2012  *RADIOLOGY REPORT*  Clinical Data: Right femur pain.  History of pancreatic carcinoma.  RIGHT FEMUR - 2 VIEW  Comparison: None.  Findings: No fracture or bone lesion.  No evidence of bony metastatic disease.  The knee and hip joints normally aligned.  The soft tissues are unremarkable.  IMPRESSION: No fracture or bone lesion.   Original Report Authenticated By: Amie Portland, M.D.    Ct Chest W Contrast  11/07/2012  *RADIOLOGY REPORT*  Clinical Data:  Restaging pancreatic carcinoma and small cell lung cancer.  CT CHEST, ABDOMEN AND PELVIS WITH CONTRAST  Technique:  Multidetector CT imaging of the chest, abdomen and pelvis was performed following the standard protocol during bolus administration of  intravenous contrast.  Contrast: OMNIPAQUE IOHEXOL 300 MG/ML  SOLN  Comparison:  The CT 07/20/2012, 05/31/2012  CT CHEST  Findings:  Within the left lower lobe there are new rounded nodules measuring 18 mm and 10 mm on image number 32 and consistent with disease recurrence/progression.  Increased nodularity in the most inferior left lung base (image 47) is also noted.  Within the left upper lobe,  there is new branching ground-glass opacities which may represent radiation change.  The left lung is clear.  There is a port in the right to chest wall.  No axillary or supraclavicular lymphadenopathy.  No mediastinal or hilar lymphadenopathy.  The  There are several subcutaneous nodules within the back and chest.  IMPRESSION:  1.  New rounded nodules in the left lower lobe most consistent with lung cancer recurrence. 2.  Subcutaneous nodules in the thorax concerning for metastasis.  CT ABDOMEN AND PELVIS  Findings:  There is marked interval enlargement of the pancreatic head mass which now encompasses the celiac trunk and SMA measuring 4.8 x 5.4 cm compared to 4.0 x 3.6 cm on prior. There is increased dilatation of the common bile duct.  There are new enhancing hepatic lesions seen on the early arterial phase consistent with hepatic metastasis. Lesion ranging in size from 9 mm to 11 mm and number approximately six.  Exemplary lesion in the left hepatic lobe measures 11 mm (image 17).  8 mm lesion in the right hepatic lobe (image number 16). The thrombus in the right portal vein and main portal vein is less well demonstrated.  There is increasing intrahepatic biliary duct dilatation.  Splenic vein appears thrombosed.  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  For example central mesenteric implant /nodal metastasis measures 14 mm (on image 62) compared to 9 mm on prior.  There multiple similar such lesions.  There is new ascites surrounding the liver extend along the pericolic gutters.  There is new  hydronephrosis of the left kidney. Potential lesion compressing the ureter externally at the ureteral pelvic junction measuring 11 mm (image 71, series 4).  In the pelvis increased pleural fluid.  There is a pessary device in the vagina.  Bladder is collapsed.  There is a peritoneal metastasis noted in the right iliac fossa.  Review bone windows demonstrates  a lucent lesion in the T12 vertebral body (image 91)  which is not clearly seen on prior.  IMPRESSION:  1.  Marked local progression of pancreatic cancer with pancreatic mass encompassing the celiac and SMA.  There is interval increase in biliary ductal dilatation and the common bile duct. 2.  New enhancing hepatic metastasis in the left right hepatic lobe. 3.  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 4.  Interval increased intraperitoneal free fluid. 5.  New  left hydronephrosis.  This presumably related to an implant obstructing the proximal left ureter.  6.  Potential lytic lesion within the lumbar spine is indeterminate.  Attention on follow-up.  These results will be called to the ordering clinician or representative by  the Radiologist Assistant, and communication documented in the PACS Dashboard.   Original Report Authenticated By: Genevive Bi, M.D.    Ct Abdomen Pelvis W Contrast  11/07/2012  *RADIOLOGY REPORT*  Clinical Data:  Restaging pancreatic carcinoma and small cell lung cancer.  CT CHEST, ABDOMEN AND PELVIS WITH CONTRAST  Technique:  Multidetector CT imaging of the chest, abdomen and pelvis was performed following the standard protocol during bolus administration of intravenous contrast.  Contrast: OMNIPAQUE IOHEXOL 300 MG/ML  SOLN  Comparison:  The CT 07/20/2012, 05/31/2012  CT CHEST  Findings:  Within the left lower lobe there are new rounded nodules measuring 18 mm and 10 mm on image number 32 and consistent with disease recurrence/progression.  Increased nodularity in the most inferior left lung base (image  47) is also noted.  Within the left upper lobe,  there is new branching ground-glass opacities which may represent radiation change.  The left lung is clear.  There is a port in the right to chest wall.  No axillary or supraclavicular lymphadenopathy.  No mediastinal or hilar lymphadenopathy.  The  There are several subcutaneous nodules within the back and chest.  IMPRESSION:  1.  New rounded nodules in the left lower lobe most consistent with lung cancer recurrence. 2.  Subcutaneous nodules in the thorax concerning for metastasis.  CT ABDOMEN AND PELVIS  Findings:  There is marked interval enlargement of the pancreatic head mass which now encompasses the celiac trunk and SMA measuring 4.8 x 5.4 cm compared to 4.0 x 3.6 cm on prior. There is increased dilatation of the common bile duct.  There are new enhancing hepatic lesions seen on the early arterial phase consistent with hepatic metastasis. Lesion ranging in size from 9 mm to 11 mm and number approximately six.  Exemplary lesion in the left hepatic lobe measures 11 mm (image 17).  8 mm lesion in the right hepatic lobe (image number 16). The thrombus in the right portal vein and main portal vein is less well demonstrated.  There is increasing intrahepatic biliary duct dilatation.  Splenic vein appears thrombosed.  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  For example central mesenteric implant /nodal metastasis measures 14 mm (on image 62) compared to 9 mm on prior.  There multiple similar such lesions.  There is new ascites surrounding the liver extend along the pericolic gutters.  There is new hydronephrosis of the left kidney. Potential lesion compressing the ureter externally at the ureteral pelvic junction measuring 11 mm (image 71, series 4).  In the pelvis increased pleural fluid.  There is a pessary device in the vagina.  Bladder is collapsed.  There is a peritoneal metastasis noted in the right iliac fossa.  Review bone windows  demonstrates  a lucent lesion in the T12 vertebral body (image 91)  which is not clearly seen on prior.  IMPRESSION:  1.  Marked local progression of pancreatic cancer with pancreatic mass encompassing the celiac and SMA.  There is interval increase in biliary ductal dilatation and the common bile duct. 2.  New enhancing hepatic metastasis in the left right hepatic lobe. 3.  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 4.  Interval increased intraperitoneal free fluid. 5.  New  left hydronephrosis.  This presumably related to an implant obstructing the proximal left ureter.  6.  Potential lytic lesion within the lumbar spine is indeterminate.  Attention on follow-up.  These results will be called to the ordering clinician or representative by the  Printmaker, and communication documented in the PACS Dashboard.   Original Report Authenticated By: Genevive Bi, M.D.    US Paracentesis  11/21/2012  *RADIOLOGY REPORT*  Clinical Data: Pancreatic cancer, ascites.  Request is made for diagnostic and therapeutic paracentesis.  ULTRASOUND GUIDED DIAGNOSTIC AND THERAPEUTIC  PARACENTESIS  An ultrasound guided paracentesis was thoroughly discussed with the patient and questions answered.  The benefits, risks, alternatives and complications were also discussed.  The patient understands and wishes to proceed with the procedure.  Written consent was obtained.  Ultrasound was performed to localize and mark an adequate pocket of fluid in the right upper to mid quadrant of the abdomen.  The area was then prepped and draped in the normal sterile fashion.  1% Lidocaine was used for local anesthesia.  Under ultrasound guidance a 19 gauge Yueh catheter was introduced.  Paracentesis was performed.  The catheter was removed and a dressing applied.  Complications:  none  Findings:  A total of approximately 3 liters of yellow fluid was removed.  A fluid sample was sent for cytology.  Incidental finding of  gallstones with right upper quadrant tenderness to palpation.  IMPRESSION: Successful ultrasound guided diagnostic and therapeutic paracentesis yielding 3 liters of ascites. Incidental note is  made of gallstones and right upper quadrant tenderness.  Read by: Jeananne Rama, P.A.-C   Original Report Authenticated By: Irish Lack, M.D.    Dg Ercp  11/25/2012  *RADIOLOGY REPORT*  Clinical Data: Common bile duct stricture. History of pancreatic cancer.  ERCP  Comparison:  CT abdomen 11/07/2012  Technique:  Multiple spot images obtained with the fluoroscopic device and submitted for interpretation post-procedure.  ERCP was performed by Dr. Arlyce Dice.  Findings: The common bile duct was cannulated and opacified.  There is narrowing of the mid and distal common bile duct.  Wire was advanced into the intrahepatic biliary system.  A metallic stent was placed across the area of narrowing.  IMPRESSION: Placement of a metallic biliary stent.  These images were submitted for radiologic interpretation only. Please see the procedural report for the amount of contrast and the fluoroscopy time utilized.   Original Report Authenticated By: Richarda Overlie, M.D.     Medications: I have reviewed the patient's current medications.  Assessment/Plan:  1. Locally advanced pancreas cancer, adenosquamous carcinoma, presenting with a pancreas body mass and CT/ultrasound evidence of portal vein/SMV invasion. She completed cycle 1 of FOLFOX 03/10/2012. She completed cycle 5 on 05/19/2012. CA 19-9 was improved on 05/19/2012. Restaging CT scan 05/31/2012 showed a decrease in the size of the pancreatic mass. She completed cycle 8 FOLFOX on 07/07/2012 and cycle 9 on 07/28/2012. A restaging CT 08/09/2012 confirmed disease progression involving retroperitoneal nodules. Initiation of gemcitabine/Abraxane on 08/11/2012. She completed cycle 4 on 09/29/2012, cycle 5 on 10/13/2012, and cycle 6 on 10/27/2012 -restaging CT 11/07/2012 confirmed  enlargement of the pancreas mass, new liver metastases, progressive peritoneal metastases, new left hydronephrosis, and new left lung nodules. 2. Delayed nausea following cycle 1 of FOLFOX. She received Aloxi and took prophylactic Decadron. 3. Pain secondary to the locally advanced pancreas mass. She continues OxyContin 40 mg twice daily.  4. Small cell lung cancer. Left perihilar/lung mass was stable on chest x-ray 04/05/2012 and on CT 05/31/2012. She completed radiation to the left chest mass on 06/29/2012 through 07/13/2012. The mass was markedly decreased on the CT 08/09/2012. Radiation changes noted on CT 11/07/2012. 5. Staging PET scan 02/12/2012 confirming a hypermetabolic left lung mass, hypermetabolic pancreas mass,  hypermetabolic peritoneal implant. Status post bronchoscopy confirming a left endobronchial mass and left hilar mass on 02/24/2012. Biopsy of both areas confirmed small cell carcinoma. 6. Baseline nausea. Question related to tumor abutting the stomach.  7. Irregular bowel habits secondary to chemotherapy and narcotics. She has noted increased constipation since beginning the gemcitabine/Abraxane. She will continue daily MiraLAX and begin Senokot-S 2 tablets twice daily. 8. Neutropenia/thrombocytopenia 07/07/2012 secondary to chemotherapy. She received Neulasta with cycle 8 and 9 FOLFOX. 9. 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 May also be related to Abraxane. She had progressive numbness in the toes and a marked decrease in vibratory sense per tuning fork exam. She reported no neuropathy symptoms today. 10. Right external hemorrhoid. She did not complain of hemorrhoidal discomfort at today's visit. 11. Neutropenia/thrombocytopenia following gemcitabine/Abraxane, chemotherapy was held on 08/18/2012. The chemotherapy was held again 08/25/2012. The gemcitabine and Abraxane were dose reduced beginning 09/01/2012. Chemotherapy was switched to an every 2  week schedule. 12. Delayed nausea following cycle 3 gemcitabine/Abraxane. Aloxi was be substituted for Zofran with cycle 4. 13. Subcutaneous nodular lesions-likely metastatic deposits from pancreas cancer. 14. Weakness with dorsiflexion of the right foot. Question if secondary to peroneal nerve compression related to weight loss. She did not complain of leg weakness today.  15. Malignant ascites status post paracentesis 11/21/2012 with pathology confirming metastatic adenocarcinoma. 16. Pelvic x-ray 11/18/2012 with findings of a pathologic fracture left lesser trochanter and metastatic deposit right iliac wing. She received a single fraction of radiation to the left proximal femur/lesser trochanter, right iliac crest on 11/29/2012. 17. 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  Disposition-Victoria Price's performance status has declined significantly. She and her family understand this is due to progressive cancer. She is not a candidate for additional chemotherapy. Dr. Truett Perna recommends a referral to the [facility] hospice program. Victoria Price is undecided regarding the hospice referral and would like to speak to her husband before making a decision. Dr. Truett Perna reviewed end-of-life issues including CPR/ACLS with Victoria Price. She is not ready to make a decision on CODE STATUS.   She or her family will contact the office once a decision regarding the hospice referral has been made. We are referring her for a therapeutic paracentesis today. She will return for a followup visit on 12/24/2011. She will contact the office in the interim as outlined above or with any other problems.  Patient seen with Dr. Truett Perna.  Lonna Cobb ANP/GNP-BC

## 2012-11-30 NOTE — Telephone Encounter (Signed)
gv and printed appt schedule for pt for Jan 2014...Victoria KitchenMarland Kitchenthe patient scheduled for paracentesis today.Victoria KitchenMarland KitchenMarland KitchenDone

## 2012-11-30 NOTE — Progress Notes (Signed)
[facility] Radiation Oncology End of Treatment Note  Name:Arlyne LANAE FEDERER  Date: 11/30/2012 XBJ:[id number] DOB:02/13/1956   Status:outpatient    CC: Juline Patch, MD  , Dr. Mancel Bale  REFERRING PHYSICIAN:   Dr. 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                        PLAN: Routine followup in one month. Patient instructed to call if questions or worsening complaints in interim.

## 2012-12-01 ENCOUNTER — Other Ambulatory Visit: Payer: Self-pay | Admitting: *Deleted

## 2012-12-01 ENCOUNTER — Telehealth: Payer: Self-pay | Admitting: *Deleted

## 2012-12-01 NOTE — Telephone Encounter (Signed)
Per desk RN I have scheduled treatment for tomorrow. Patient called.    JMW

## 2012-12-01 NOTE — Telephone Encounter (Signed)
Received phone call from patient's husband, Arlys John, regarding the decision to go with Hospice as recommended by Dr. Truett Perna.  Dr. Truett Perna was informed and said he would begin the referral process.  This RN informed Mr. Smolen to please contact this office if he had not heard anything from hospice by 12/02/12.  He verbalized understanding and denied any other needs at the time.

## 2012-12-01 NOTE — Telephone Encounter (Signed)
Faxed Hospice Referral [phone].

## 2012-12-02 ENCOUNTER — Other Ambulatory Visit: Payer: Self-pay | Admitting: Oncology

## 2012-12-02 ENCOUNTER — Telehealth: Payer: Self-pay | Admitting: *Deleted

## 2012-12-02 ENCOUNTER — Ambulatory Visit (HOSPITAL_BASED_OUTPATIENT_CLINIC_OR_DEPARTMENT_OTHER): Payer: BC Managed Care – PPO

## 2012-12-02 ENCOUNTER — Ambulatory Visit: Payer: BC Managed Care – PPO

## 2012-12-02 ENCOUNTER — Other Ambulatory Visit: Payer: BC Managed Care – PPO | Admitting: Lab

## 2012-12-02 DIAGNOSIS — C251 Malignant neoplasm of body of pancreas: Secondary | ICD-10-CM

## 2012-12-02 DIAGNOSIS — C7951 Secondary malignant neoplasm of bone: Secondary | ICD-10-CM

## 2012-12-02 MED ORDER — SODIUM CHLORIDE 0.9 % IJ SOLN
10.0000 mL | INTRAMUSCULAR | Status: DC | PRN
  Administered 2012-12-02: 10 mL
  Filled 2012-12-02: qty 10

## 2012-12-02 MED ORDER — HEPARIN SOD (PORK) LOCK FLUSH 100 UNIT/ML IV SOLN
500.0000 [IU] | Freq: Once | INTRAVENOUS | Status: AC | PRN
  Administered 2012-12-02: 500 [IU]
  Filled 2012-12-02: qty 5

## 2012-12-02 MED ORDER — SODIUM CHLORIDE 0.9 % IV SOLN
Freq: Once | INTRAVENOUS | Status: AC
  Administered 2012-12-02: 13:00:00 via INTRAVENOUS

## 2012-12-02 MED ORDER — ZOLEDRONIC ACID 4 MG/100ML IV SOLN
4.0000 mg | Freq: Once | INTRAVENOUS | Status: AC
  Administered 2012-12-02: 4 mg via INTRAVENOUS
  Filled 2012-12-02: qty 100

## 2012-12-02 NOTE — Patient Instructions (Addendum)
Zometa (Zoledronic Acid) What is this medicine? ZOLEDRONIC ACID (ZOE le dron ik AS id) lowers the amount of calcium loss from bone. It is used to treat too much calcium in your blood from cancer. It is also used to prevent complications of cancer that has spread to the bone. This medicine may be used for other purposes; ask your health care provider or pharmacist if you have questions.  What should I tell my health care provider before I take this medicine? They need to know if you have any of these conditions: -aspirin-sensitive asthma -dental disease -kidney disease -an unusual or allergic reaction to zoledronic acid, other medicines, foods, dyes, or preservatives -pregnant or trying to get pregnant -breast-feeding  How should I use this medicine? This medicine is for infusion into a vein. It is given by a health care professional in a hospital or clinic setting. Talk to your pediatrician regarding the use of this medicine in children. Special care may be needed. Overdosage: If you think you have taken too much of this medicine contact a poison control center or emergency room at once. NOTE: This medicine is only for you. Do not share this medicine with others. What if I miss a dose? It is important not to miss your dose. Call your doctor or health care professional if you are unable to keep an appointment.  What may interact with this medicine? -certain antibiotics given by injection -NSAIDs, medicines for pain and inflammation, like ibuprofen or naproxen -some diuretics like bumetanide, furosemide -teriparatide -thalidomide This list may not describe all possible interactions. Give your health care provider a list of all the medicines, herbs, non-prescription drugs, or dietary supplements you use. Also tell them if you smoke, drink alcohol, or use illegal drugs. Some items may interact with your medicine.  What should I watch for while using this medicine? Visit your doctor or  health care professional for regular checkups. It may be some time before you see the benefit from this medicine. Do not stop taking your medicine unless your doctor tells you to. Your doctor may order blood tests or other tests to see how you are doing. Women should inform their doctor if they wish to become pregnant or think they might be pregnant. There is a potential for serious side effects to an unborn child. Talk to your health care professional or pharmacist for more information. You should make sure that you get enough calcium and vitamin D while you are taking this medicine. Discuss the foods you eat and the vitamins you take with your health care professional. Some people who take this medicine have severe bone, joint, and/or muscle pain. This medicine may also increase your risk for a broken thigh bone. Tell your doctor right away if you have pain in your upper leg or groin. Tell your doctor if you have any pain that does not go away or that gets worse.  What side effects may I notice from receiving this medicine? Side effects that you should report to your doctor or health care professional as soon as possible: -allergic reactions like skin rash, itching or hives, swelling of the face, lips, or tongue -anxiety, confusion, or depression -breathing problems -changes in vision -feeling faint or lightheaded, falls -jaw burning, cramping, pain -muscle cramps, stiffness, or weakness -trouble passing urine or change in the amount of urine  Side effects that usually do not require medical attention (report to your doctor or health care professional if they continue or are bothersome): -bone,   joint, or muscle pain -fever -hair loss -irritation at site where injected -loss of appetite -nausea, vomiting -stomach upset -tired This list may not describe all possible side effects. Call your doctor for medical advice about side effects. You may report side effects to FDA at  1-800-FDA-1088. Where should I keep my medicine? This drug is given in a hospital or clinic and will not be stored at home. NOTE: This sheet is a summary. It may not cover all possible information. If you have questions about this medicine, talk to your doctor, pharmacist, or health care provider.  2012, [employer]. (05/29/2011 9:06:58 AM) 

## 2012-12-02 NOTE — Telephone Encounter (Signed)
Confirmed Dr. Truett Perna will be attending physician w/Hospice. Also Hospice MD may assist with symptom management.

## 2012-12-08 ENCOUNTER — Ambulatory Visit: Payer: BC Managed Care – PPO

## 2012-12-08 ENCOUNTER — Other Ambulatory Visit: Payer: BC Managed Care – PPO | Admitting: Lab

## 2012-12-08 ENCOUNTER — Ambulatory Visit: Payer: BC Managed Care – PPO | Admitting: Oncology

## 2012-12-13 ENCOUNTER — Telehealth: Payer: Self-pay | Admitting: *Deleted

## 2012-12-13 DIAGNOSIS — C259 Malignant neoplasm of pancreas, unspecified: Secondary | ICD-10-CM

## 2012-12-13 NOTE — Telephone Encounter (Signed)
Rapid decline and will be transferred to [facility] soon. Family has agreed to DNR status and hospice physician willing to sign DNR form if OK with Dr. Truett Perna. Permission given.

## 2012-12-15 ENCOUNTER — Other Ambulatory Visit: Payer: BC Managed Care – PPO | Admitting: Lab

## 2012-12-15 ENCOUNTER — Ambulatory Visit: Payer: BC Managed Care – PPO

## 2012-12-15 ENCOUNTER — Telehealth: Payer: Self-pay | Admitting: *Deleted

## 2012-12-15 ENCOUNTER — Ambulatory Visit: Payer: BC Managed Care – PPO | Admitting: Nurse Practitioner

## 2012-12-15 NOTE — Telephone Encounter (Signed)
Notified by [facility] that pt passed Jan 09, 2013 at 2:22 am.

## 2012-12-16 ENCOUNTER — Telehealth: Payer: Self-pay | Admitting: Oncology

## 2012-12-16 NOTE — Telephone Encounter (Signed)
Pt's daughter called and wants all appts tobe cancelled , pt expired, nurse notified

## 2012-12-22 ENCOUNTER — Ambulatory Visit: Payer: BC Managed Care – PPO

## 2012-12-22 ENCOUNTER — Other Ambulatory Visit: Payer: BC Managed Care – PPO | Admitting: Lab

## 2012-12-23 ENCOUNTER — Other Ambulatory Visit: Payer: BC Managed Care – PPO | Admitting: Lab

## 2012-12-23 ENCOUNTER — Ambulatory Visit: Payer: BC Managed Care – PPO | Admitting: Oncology

## 2012-12-29 ENCOUNTER — Ambulatory Visit: Payer: BC Managed Care – PPO

## 2012-12-29 ENCOUNTER — Other Ambulatory Visit: Payer: BC Managed Care – PPO | Admitting: Lab

## 2013-01-14 DEATH — deceased

## 2014-04-19 IMAGING — CT CT CHEST W/ CM
2 of 9 series · 14 of 46 positions shown, 16 images · IV contrast (OMNIPAQUE)
Comparison: The CT 07/20/2012, 05/31/2012

CT CHEST

CLINICAL DATA: Restaging pancreatic carcinoma and small cell lung
cancer.

CT CHEST, ABDOMEN AND PELVIS WITH CONTRAST
TECHNIQUE: Multidetector CT imaging of the chest, abdomen and
pelvis was performed following the standard protocol during bolus
administration of intravenous contrast.
Contrast: 100mL OMNIPAQUE IOHEXOL 300 MG/ML  SOLN

[Series 7: venous thins pacs · axial · portal-venous · 0.74mm/px · z∈[-544,-34]mm · 11 of 204 slices shown, 13 images]
[im 17/204  soft-tissue]
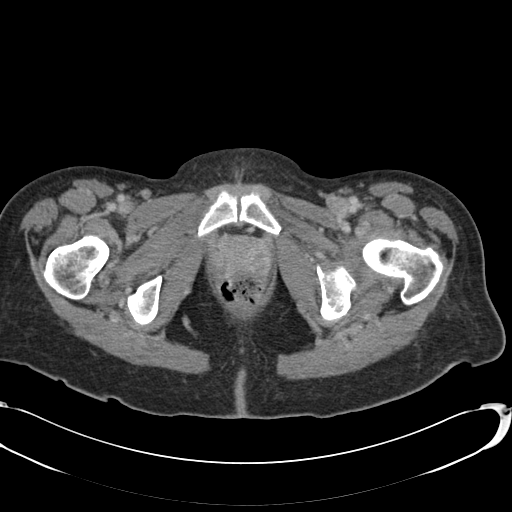
[im 17/204  bone]
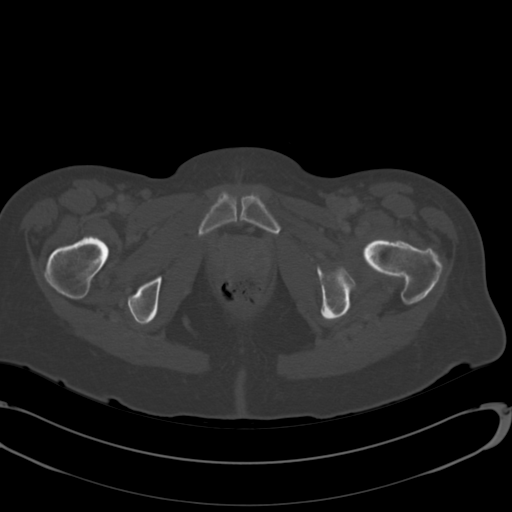
[im 34/204  soft-tissue]
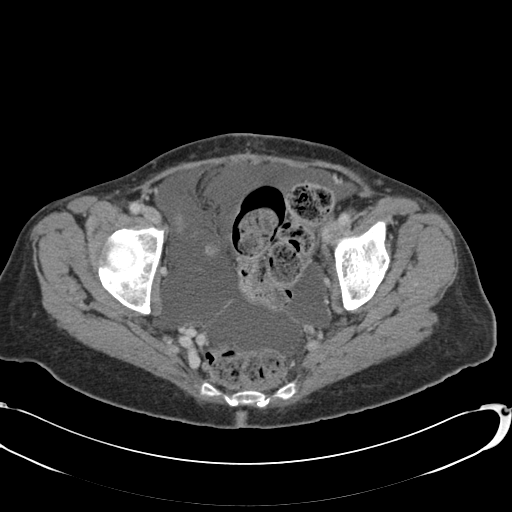
[im 51/204  soft-tissue]
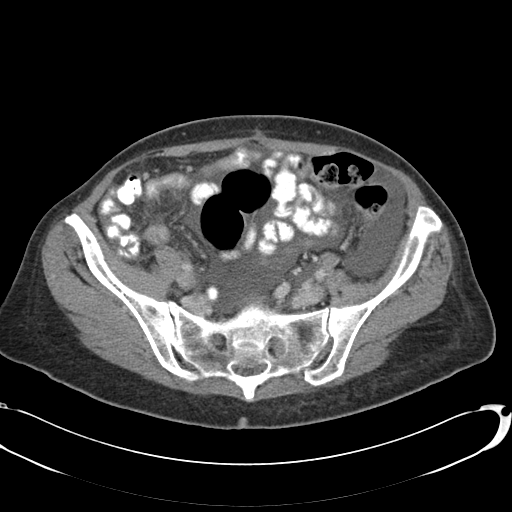
[im 68/204  soft-tissue]
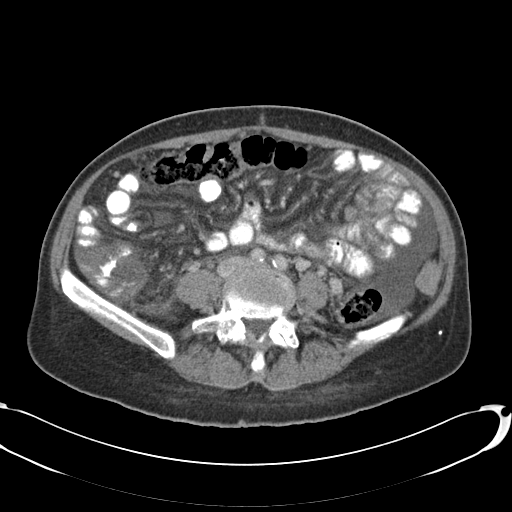
[im 85/204  soft-tissue]
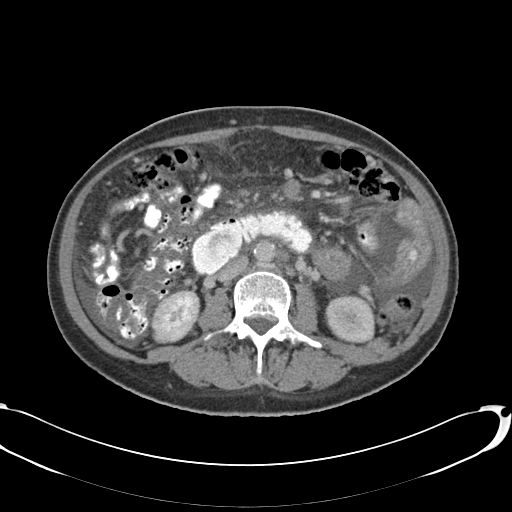
[im 102/204  soft-tissue]
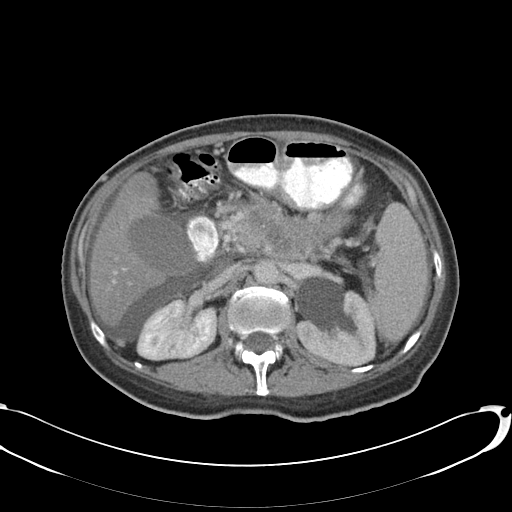
[im 119/204  soft-tissue]
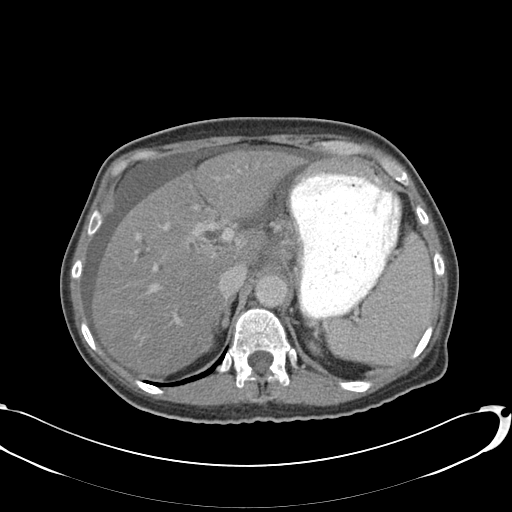
[im 136/204  soft-tissue]
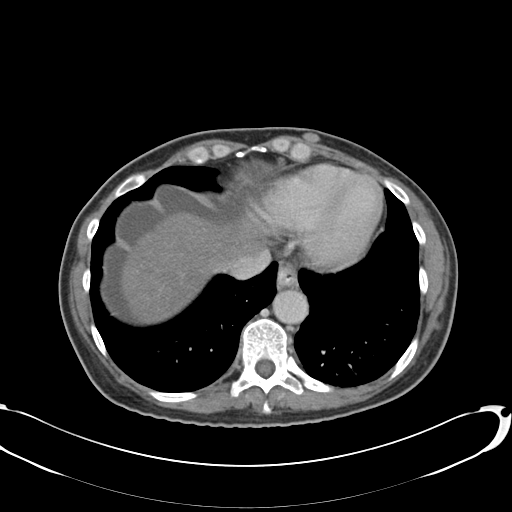
[im 153/204  soft-tissue]
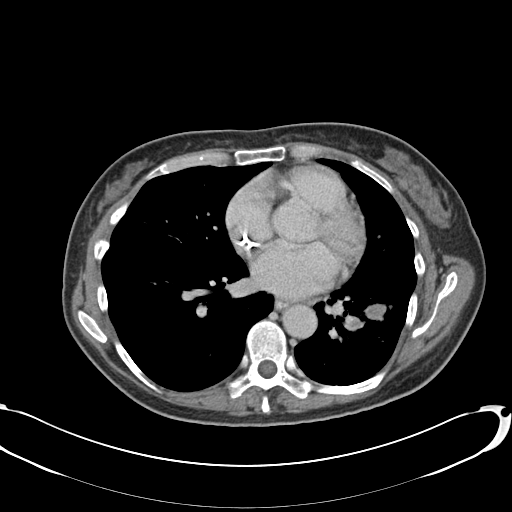
[im 153/204  bone]
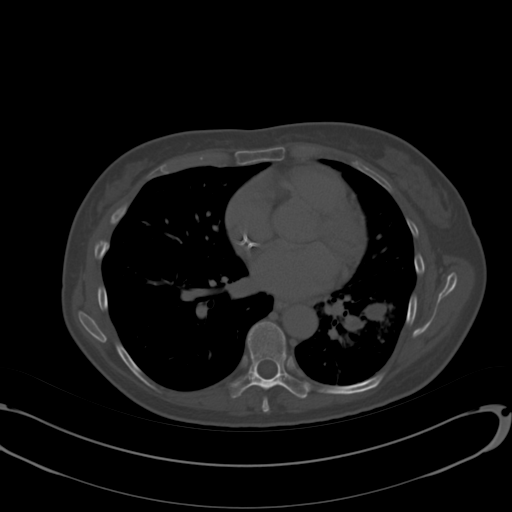
[im 170/204  soft-tissue]
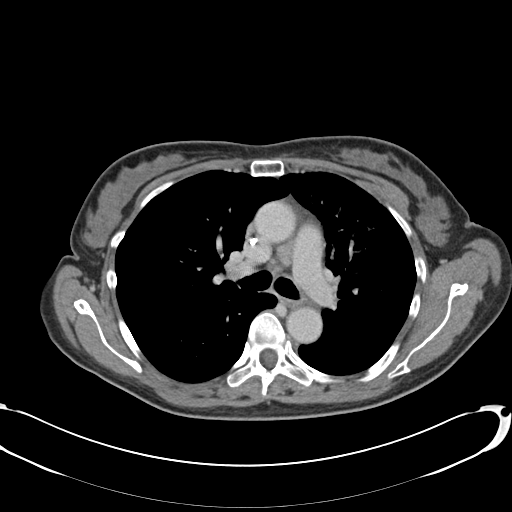
[im 187/204  soft-tissue]
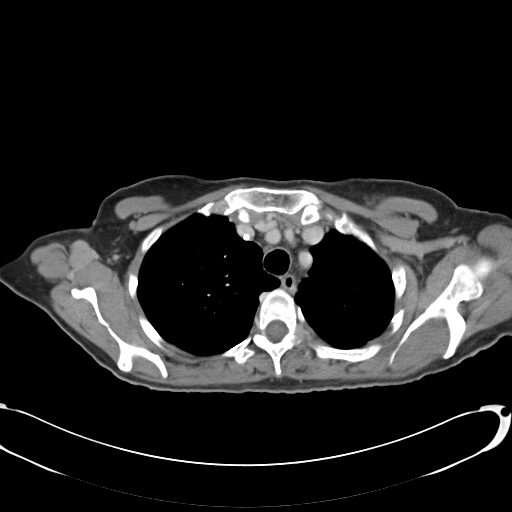

[Series 602: <mpr thick range> · coronal · 0.74mm/px · 3 of 109 slices shown]
[im 22/109  soft-tissue]
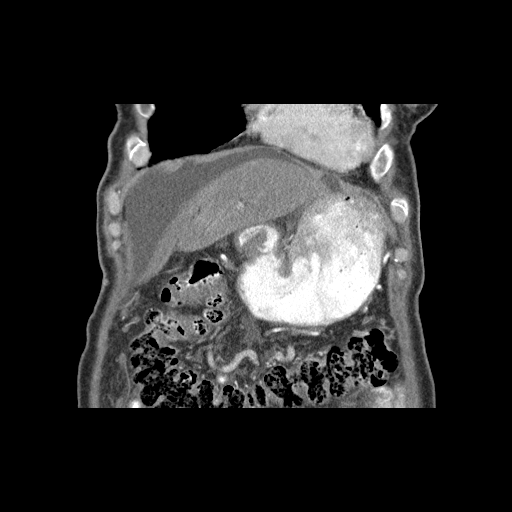
[im 44/109  soft-tissue]
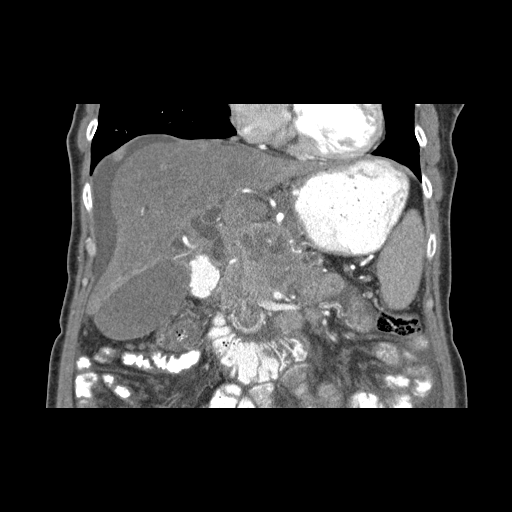
[im 65/109  soft-tissue]
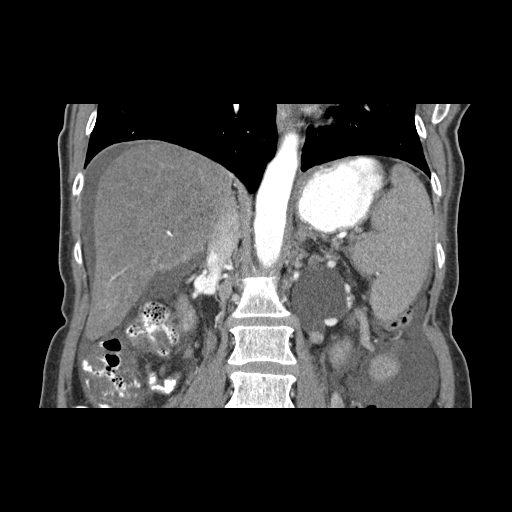

[14 of 46 positions shown; findings below may reference images not displayed]

FINDINGS: Within the left lower lobe there are new rounded nodules
measuring 18 mm and 10 mm on image number 32 and consistent with
disease recurrence/progression.  Increased nodularity in the most
inferior left lung base (image 47) is also noted.  Within the left
upper lobe,  there is new branching ground-glass opacities which
may represent radiation change.  The left lung is clear.

There is a port in the right to chest wall.  No axillary or
supraclavicular lymphadenopathy.  No mediastinal or hilar
lymphadenopathy.  The

There are several subcutaneous nodules within the back and chest.
IMPRESSION: 1..  New rounded nodules in the left lower lobe most consistent
with lung cancer recurrence.
2.  Subcutaneous nodules in the thorax concerning for metastasis.

CT ABDOMEN AND PELVIS
FINDINGS: There is marked interval enlargement of the pancreatic
head mass which now encompasses the celiac trunk and SMA measuring
4.8 x 5.4 cm compared to 4.0 x 3.6 cm on prior. There is increased
dilatation of the common bile duct.

There are new enhancing hepatic lesions seen on the early arterial
phase consistent with hepatic metastasis. Lesion ranging in size
from 9 mm to 11 mm and number approximately six..  Exemplary lesion
in the left hepatic lobe measures 11 mm (image 17).  8 mm lesion in
the right hepatic lobe (image number 16). The thrombus in the right
portal vein and main portal vein is less well demonstrated.  There
is increasing intrahepatic biliary duct dilatation.  Splenic vein
appears thrombosed.

There is interval progression of peritoneal metastasis.  For
example central mesenteric implant /nodal metastasis measures 14 mm
(on image 62) compared to 9 mm on prior.  There multiple similar
such lesions.

There is new ascites surrounding the liver extend along the
pericolic gutters.

There is new hydronephrosis of the left kidney. Potential lesion
compressing the ureter externally at the ureteral pelvic junction
measuring 11 mm (image 71, series 4).

In the pelvis increased pleural fluid.  There is a pessary device
in the vagina.  Bladder is collapsed.  There is a peritoneal
metastasis noted in the right iliac fossa.

Review bone windows demonstrates  a lucent lesion in the T12
vertebral body (image 91)  which is not clearly seen on prior.
IMPRESSION: 1.  Marked local progression of pancreatic cancer with pancreatic
mass encompassing the celiac and SMA.  There is interval increase
in biliary ductal dilatation and the common bile duct.
2.  New enhancing hepatic metastasis in the left right hepatic
lobe.
3.  Advancement of mesenteric and peritoneal metastatic implants.
4.  Interval increased intraperitoneal free fluid.
5.  New  left hydronephrosis.  This presumably related to an
implant obstructing the proximal left ureter.

6.  Potential lytic lesion within the lumbar spine is
indeterminate.  Attention on follow-up.

These results will be called to the ordering clinician or
representative by the Radiologist Assistant, and communication
documented in the PACS Dashboard.

## 2014-08-08 ENCOUNTER — Other Ambulatory Visit: Payer: Self-pay | Admitting: Pharmacist
